# Patient Record
Sex: Male | Born: 1995 | Race: White | Hispanic: No | Marital: Single | Smoking: Never smoker
Health system: Southern US, Community
[De-identification: ages and names within clinical notes are randomized; demographics above are authoritative.]

## PROBLEM LIST (undated history)

## (undated) DIAGNOSIS — R5383 Other fatigue: Secondary | ICD-10-CM

## (undated) DIAGNOSIS — R112 Nausea with vomiting, unspecified: Secondary | ICD-10-CM

## (undated) DIAGNOSIS — K589 Irritable bowel syndrome without diarrhea: Secondary | ICD-10-CM

## (undated) DIAGNOSIS — R51 Headache: Secondary | ICD-10-CM

## (undated) HISTORY — DX: Nausea with vomiting, unspecified: R11.2

## (undated) HISTORY — DX: Irritable bowel syndrome without diarrhea: K58.9

## (undated) HISTORY — DX: Other fatigue: R53.83

## (undated) HISTORY — DX: Headache: R51

---
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
## 2015-01-23 ENCOUNTER — Ambulatory Visit (INDEPENDENT_AMBULATORY_CARE_PROVIDER_SITE_OTHER): Payer: 59 | Admitting: Family Medicine

## 2015-01-23 VITALS — BP 104/76 | HR 76 | Temp 98.0°F | Resp 16 | Ht 73.5 in | Wt 138.0 lb

## 2015-01-23 DIAGNOSIS — K6289 Other specified diseases of anus and rectum: Secondary | ICD-10-CM | POA: Diagnosis not present

## 2015-01-23 DIAGNOSIS — K5090 Crohn's disease, unspecified, without complications: Secondary | ICD-10-CM | POA: Insufficient documentation

## 2015-01-23 DIAGNOSIS — K612 Anorectal abscess: Secondary | ICD-10-CM | POA: Diagnosis not present

## 2015-01-23 DIAGNOSIS — K50919 Crohn's disease, unspecified, with unspecified complications: Secondary | ICD-10-CM | POA: Diagnosis not present

## 2015-01-23 DIAGNOSIS — N433 Hydrocele, unspecified: Secondary | ICD-10-CM | POA: Insufficient documentation

## 2015-01-23 MED ORDER — METRONIDAZOLE 500 MG PO TABS: 500.0000 mg | ORAL_TABLET | Freq: Two times a day (BID) | ORAL | Status: DC

## 2015-01-23 MED ORDER — HYDROCODONE-ACETAMINOPHEN 5-325 MG PO TABS: 1.0000 | ORAL_TABLET | Freq: Four times a day (QID) | ORAL | Status: DC | PRN

## 2015-01-23 NOTE — Progress Notes (Addendum)
Subjective:    Patient ID: Richard Beasley, male    DOB: 1996/04/12, 19 y.o.   MRN: [id number] This chart was scribed for Merri Ray, MD by Steva Colder, ED Scribe. The patient was seen in room 2 at 2:49 PM.   Chief Complaint  Patient presents with  . Mass    rectal region, x 2-3 days    HPI  Richard Beasley is a 19 y.o. male with a medical hx of Crohn's who presents today complaining of mass onset 2-3 days. He reports that he has an abscess right above the anus and he last had one 4-5 years ago and he had it drained. When he had it drained last time it was higher up on his buttocks. Pt initially noticed discomfort and pain when he was walking today. Pt last bowel movement was yesterday that was of a diarrhea consistency. He denies fevers, chills, back pain, abdominal pain, difficulty urinating, and any other symptoms.   Pt is followed at [facility] for his Crohn's and he was last seen in March 2016. Pt takes azathioprine 50 mg 2 tabs daily. Pt has had multiple surgeries for his various ailments. Pt mother called the [place] specialist today and they didn't have an available appointment to see the pt today so they called in Cipro that the pt began today. Pt will not be able to be seen at [facility] until February 02, 2015. Pt notes that his mother has a hx of crohn's.   There are no active problems to display for this patient.  Past Medical History  Diagnosis Date  . Fatigue 01/19/2011  . Generalized headaches 01/19/2011  . Pilonidal cyst 01/19/2011  . Nausea & vomiting 01/19/2011  . IBS (irritable bowel syndrome) 01/19/2011   Past Surgical History  Procedure Laterality Date  . Hydrocele excision / repair  2009  . Elbow fracture surgery  2000  . Fracture surgery  2000    Elbow   Allergies  Allergen Reactions  . Ceclor [Cefaclor]    Prior to Admission medications   Not on File      Review of Systems  Constitutional: Negative for fever and chills.  Gastrointestinal: Positive  for diarrhea. Negative for abdominal pain.  Genitourinary: Negative for difficulty urinating.  Musculoskeletal: Negative for back pain.  Skin:       Abscess to anus       Objective:   Physical Exam  Constitutional: He is oriented to person, place, and time. He appears well-developed and well-nourished. No distress.  HENT:  Head: Normocephalic and atraumatic.  Eyes: EOM are normal.  Neck: Neck supple. No tracheal deviation present.  Cardiovascular: Normal rate, regular rhythm and normal heart sounds.  Exam reveals no gallop and no friction rub.   No murmur heard. Pulmonary/Chest: Effort normal and breath sounds normal. No respiratory distress. He has no wheezes. He has no rales.  Abdominal: Soft. He exhibits no distension. There is no tenderness. There is no CVA tenderness.  Genitourinary:  Approximately 1 cm fluctuant swollen area just superior and left side of the anus. TTP without active discharge. Declined rectal exam to determine if it was a communicating abscess or fistula. No other swelling or abscess noted to the cunneal cleft.   Musculoskeletal: Normal range of motion.  Neurological: He is alert and oriented to person, place, and time.  Skin: Skin is warm and dry.  Psychiatric: He has a normal mood and affect. His behavior is normal.  Nursing note and vitals  reviewed.     BP 104/76 mmHg  Pulse 76  Temp(Src) 98 F (36.7 C)  Resp 16  Ht 6' 1.5" (1.867 m)  Wt 138 lb (62.596 kg)  BMI 17.96 kg/m2  SpO2 99%  3:50 PM Discussed with his surgeon - Dr. Lovett Sox. Recommended I and D, Rx Cipro and Flagyl, with follow up in next 1-2 weeks.options discussed with patient and parent for I and D here or trough emergency room at [facility]. They chose to have procedure here with follow up as scheduled with Dr. Lovett Sox.   Risks (including but not limited to bleeding and infection, or injury to underlying tissues), benefits, and alternatives discussed for superficial  I and D of anorectal abscess.   Verbal consent obtained after any questions were answered. Landmarks noted. Area cleansed with Betadine x3.  Local anesthesia with lidocaine 1% to periphery and central area of abcess injected with 0.8cc lidocaine 1% plain. small amount of yellow purulence noted from area of injection for anesthesia. #15 blade used to incise central area of swelling - 3-29m incision, with depth of approximately 475m  No other exudate expressed. Area of swelling soft, but still some notable swelling. No complications. Gauze applied.  EBL less than 0.5cc. RTC/ER precautions discussed.    Assessment & Plan:   Richard Mcgranahans a [age].o. male Anorectal abscess - Plan: metroNIDAZOLE (FLAGYL) 500 MG tablet  Crohn's disease, unspecified complication  Anal or rectal pain - Plan: HYDROcodone-acetaminophen (NORCO/VICODIN) 5-325 MG per tablet Early anorectal abscess likely. Discussed with his surgeon - [facility] and decided to try superficial I and D.  Smaller amount of purulence expressed than expected, but with superficial I and D - discussed with patient and further exploration or deeper incision deferred.   -will try warm soaks, sitz baths, continue Cipro, add Flagyl. Hydrocodone Rx #10 (SED) if needed for pain, but if any increased swelling or pain in next 2 days - return here [facility] other providers are in that system. Plan discussed with parent and patient. Understanding expressed.   - if improving - follow up with Dr. RaLovett Soxs planned in 10 days.    Meds ordered this encounter  Medications  . ciprofloxacin (CIPRO) 500 MG tablet    Sig: Take 500 mg by mouth.  . azaTHIOprine (IMURAN) 50 MG tablet    Sig: TAKE 2 TABLETS BY MOUTH EVERY DAY  . colestipol (COLESTID) 1 G tablet    Sig: Take by mouth.  . Marland KitchenYDROcodone-acetaminophen (NORCO/VICODIN) 5-325 MG per tablet    Sig: Take 1 tablet by mouth every 6 (six) hours as needed for moderate pain.    Dispense:  10  tablet    Refill:  0  . metroNIDAZOLE (FLAGYL) 500 MG tablet    Sig: Take 1 tablet (500 mg total) by mouth 2 (two) times daily.    Dispense:  20 tablet    Refill:  0   Patient Instructions  THere was only a small amount of pus that was removed with the procedure tonight. Warm soaks/sitz baths, start metronidazole and continue cipro. Hydrocodone if needeed for pain, but if swelling in the affected area increases - you may need to have another attempt at pus removal.  [facility]. Return to the clinic or go to the nearest emergency room if any of your symptoms worsen or new symptoms occur. Keep  follow up as planned with your surgeon.     Peri-Rectal Abscess Your caregiver has diagnosed you as having a peri-rectal abscess. This is an infected area near the rectum that is filled with pus. If the abscess is near the surface of the skin, your caregiver may open (incise) the area and drain the pus. HOME CARE INSTRUCTIONS   If your abscess was opened up and drained. A small piece of gauze may be placed in the opening so that it can drain. Do not remove the gauze unless directed by your caregiver.  A loose dressing may be placed over the abscess site. Change the dressing as often as necessary to keep it clean and dry.  After the drain is removed, the area may be washed with a gentle antiseptic (soap) four times per day.  A warm sitz bath, warm packs or heating pad may be used for pain relief, taking care not to burn yourself.  Return for a wound check in 1 day or as directed.  An "inflatable doughnut" may be used for sitting with added comfort. These can be purchased at a drugstore or medical supply house.  To reduce pain and straining with bowel movements, eat a high fiber diet with plenty of fruits and vegetables. Use stool softeners as recommended by your caregiver. This is especially important if narcotic type pain medications  were prescribed as these may cause marked constipation.  Only take over-the-counter or prescription medicines for pain, discomfort, or fever as directed by your caregiver. SEEK IMMEDIATE MEDICAL CARE IF:   You have increasing pain that is not controlled by medication.  There is increased inflammation (redness), swelling, bleeding, or drainage from the area.  An oral temperature above 102 F (38.9 C) develops.  You develop chills or generalized malaise (feel lethargic or feel "washed out").  You develop any new symptoms (problems) you feel may be related to your present problem. Document Released: 08/12/2000 Document Revised: 11/07/2011 Document Reviewed: 08/12/2008 [facility] Patient Information 2015 [place], [place]. This information is not intended to replace advice given to you by your health care provider. Make sure you discuss any questions you have with your health care provider.     I personally performed the services described in this documentation, which was scribed in my presence. The recorded information has been reviewed and considered, and addended by me as needed.

## 2015-01-23 NOTE — Patient Instructions (Addendum)
THere was only a small amount of pus that was removed with the procedure tonight. Warm soaks/sitz baths, start metronidazole and continue cipro. Hydrocodone if needeed for pain, but if swelling in the affected area increases - you may need to have another attempt at pus removal.  Return here or emergency room at [facility] over the weekend if needed. Return to the clinic or go to the nearest emergency room if any of your symptoms worsen or new symptoms occur. Keep follow up as planned with your surgeon.     Peri-Rectal Abscess Your caregiver has diagnosed you as having a peri-rectal abscess. This is an infected area near the rectum that is filled with pus. If the abscess is near the surface of the skin, your caregiver may open (incise) the area and drain the pus. HOME CARE INSTRUCTIONS   If your abscess was opened up and drained. A small piece of gauze may be placed in the opening so that it can drain. Do not remove the gauze unless directed by your caregiver.  A loose dressing may be placed over the abscess site. Change the dressing as often as necessary to keep it clean and dry.  After the drain is removed, the area may be washed with a gentle antiseptic (soap) four times per day.  A warm sitz bath, warm packs or heating pad may be used for pain relief, taking care not to burn yourself.  Return for a wound check in 1 day or as directed.  An "inflatable doughnut" may be used for sitting with added comfort. These can be purchased at a drugstore or medical supply house.  To reduce pain and straining with bowel movements, eat a high fiber diet with plenty of fruits and vegetables. Use stool softeners as recommended by your caregiver. This is especially important if narcotic type pain medications were prescribed as these may cause marked constipation.  Only take over-the-counter or prescription medicines for pain, discomfort, or fever as directed by your caregiver. SEEK IMMEDIATE MEDICAL  CARE IF:   You have increasing pain that is not controlled by medication.  There is increased inflammation (redness), swelling, bleeding, or drainage from the area.  An oral temperature above 102 F (38.9 C) develops.  You develop chills or generalized malaise (feel lethargic or feel "washed out").  You develop any new symptoms (problems) you feel may be related to your present problem. Document Released: 08/12/2000 Document Revised: 11/07/2011 Document Reviewed: 08/12/2008 [facility] Patient Information 2015 [place], [place]. This information is not intended to replace advice given to you by your health care provider. Make sure you discuss any questions you have with your health care provider.

## 2015-01-24 ENCOUNTER — Telehealth: Payer: Self-pay | Admitting: Family Medicine

## 2015-01-24 NOTE — Telephone Encounter (Signed)
Called home number to check status since his visit yesterday. No answer - LMOM

## 2015-01-25 ENCOUNTER — Telehealth: Payer: Self-pay | Admitting: Family Medicine

## 2015-01-25 NOTE — Telephone Encounter (Signed)
Spoke with Richard Beasley and his mom. Swollen area is better, no bleeding. No fever, tolerating antibiotics. Normal BM today that was not painful. Has not done sitz baths.   - discussed continuing cipro, flagyl, and planned follow up on June 6th with Colorectal surgeon.   - sitz baths may help area heal further.    -if any increased pain, swelling, fever, or painful BM's - rtc for recheck or with his care provider at [facility].   -Understanding expressed and all questions answered.

## 2015-04-26 ENCOUNTER — Ambulatory Visit (INDEPENDENT_AMBULATORY_CARE_PROVIDER_SITE_OTHER): Payer: 59 | Admitting: Internal Medicine

## 2015-04-26 ENCOUNTER — Ambulatory Visit (INDEPENDENT_AMBULATORY_CARE_PROVIDER_SITE_OTHER): Payer: 59

## 2015-04-26 VITALS — BP 127/70 | HR 59 | Temp 97.8°F | Ht 73.5 in | Wt 138.5 lb

## 2015-04-26 DIAGNOSIS — M545 Low back pain, unspecified: Secondary | ICD-10-CM

## 2015-04-26 MED ORDER — HYDROCODONE-ACETAMINOPHEN 5-325 MG PO TABS: ORAL_TABLET | ORAL | Status: DC

## 2015-04-26 MED ORDER — CYCLOBENZAPRINE HCL 10 MG PO TABS: 10.0000 mg | ORAL_TABLET | Freq: Every day | ORAL | Status: DC

## 2015-04-26 MED ORDER — MELOXICAM 15 MG PO TABS: 15.0000 mg | ORAL_TABLET | Freq: Every day | ORAL | Status: DC

## 2015-04-26 NOTE — Progress Notes (Signed)
Subjective:  This chart was scribed for Richard Lin, MD by [name], Medical Scribe. This patient was seen in Room 1 and the patient's care was started at 1:46 PM.     Patient ID: Richard Beasley, male    DOB: Jun 25, 1996, 19 y.o.   MRN: [id number] Chief Complaint  Patient presents with  . Back Pain    off & on x 1week    HPI Richard Beasley is a 19 y.o. male who presents to [facility] complaining of intermittent back pain for about a week. He usually has this issue for about over 6 months as he moves cases for work with lifting. When he has this issue, he usually applies icy hot pads and will feel better the next day. He notes that his left side hurts more than the right side. He denies any sleep disturbances.   He works for [employer].   Patient Active Problem List   Diagnosis Date Noted  . Hydrocele, left 01/23/2015  . Crohn's disease 01/23/2015    Current outpatient prescriptions:  .  colestipol (COLESTID) 1 G tablet, Take by mouth., Disp: , Rfl:  .  azaTHIOprine (IMURAN) 50 MG tablet, TAKE 2 TABLETS BY MOUTH EVERY DAY, Disp: , Rfl:  .  HYDROcodone-acetaminophen (NORCO/VICODIN) 5-325 MG per tablet, Take 1 tablet by mouth every 6 (six) hours as needed for moderate pain. (Patient not taking: Reported on 04/26/2015), Disp: 10 tablet, Rfl: 0 .  metroNIDAZOLE (FLAGYL) 500 MG tablet, Take 1 tablet (500 mg total) by mouth 2 (two) times daily. (Patient not taking: Reported on 04/26/2015), Disp: 20 tablet, Rfl: 0    Review of Systems  Constitutional: Negative for fever, chills and fatigue.  Musculoskeletal: Positive for back pain.  Skin: Negative for wound.  Psychiatric/Behavioral: Negative for sleep disturbance.       Objective:   Physical Exam  Constitutional: He is oriented to person, place, and time. He appears well-developed and well-nourished. No distress.  HENT:  Head: Normocephalic and atraumatic.  Eyes: EOM are normal. Pupils are equal, round, and reactive to  light.  Neck: Neck supple.  Cardiovascular: Normal rate.   Pulmonary/Chest: Effort normal. No respiratory distress.  Musculoskeletal: Normal range of motion.  Tender over lumbar midline pons straight leg raise at 75 on the right but negative on the left. Mild pain with twisting and extension. Deep tendon reflexes symmetrical. No sensory losses and lower extremities.  Neurological: He is alert and oriented to person, place, and time.  Skin: Skin is warm and dry.  Psychiatric: He has a normal mood and affect. His behavior is normal.  Nursing note and vitals reviewed.   BP 127/70 mmHg  Pulse 59  Temp(Src) 97.8 F (36.6 C) (Oral)  Ht 6' 1.5" (1.867 m)  Wt 138 lb 8 oz (62.823 kg)  BMI 18.02 kg/m2  SpO2 99%  [facility] reading (PRIMARY) by  Dr. Laney Pastor? 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     Assessment & Plan:  Acute superimposed on chronic low back pain secondary to lifting injury Meds ordered this encounter  Medications  . cyclobenzaprine (FLEXERIL) 10 MG tablet    Sig: Take 1 tablet (10 mg total) by mouth at bedtime.    Dispense:  30 tablet    Refill:  0  . meloxicam (MOBIC) 15 MG tablet    Sig: Take 1 tablet (15 mg total) by mouth daily.    Dispense:  30 tablet    Refill:  0  . HYDROcodone-acetaminophen (NORCO/VICODIN) 5-325 MG  per tablet    Sig: 1 at bedtime for back pain--may repeat in 4-6h if wakes    Dispense:  10 tablet    Refill:  0   Exercises given If not resp. Do PT I have completed the patient encounter in its entirety as documented by the scribe, with editing by me where necessary. Olie Dibert P. Laney Pastor, M.D.

## 2015-04-26 NOTE — Patient Instructions (Signed)

## 2015-06-04 ENCOUNTER — Emergency Department (HOSPITAL_COMMUNITY): Payer: 59

## 2015-06-04 ENCOUNTER — Emergency Department (HOSPITAL_COMMUNITY)
Admission: EM | Admit: 2015-06-04 | Discharge: 2015-06-05 | Disposition: A | Discharge status: Home / Self Care | Payer: 59 | Attending: Emergency Medicine | Admitting: Emergency Medicine

## 2015-06-04 ENCOUNTER — Encounter (HOSPITAL_COMMUNITY): Payer: Self-pay

## 2015-06-04 DIAGNOSIS — N50812 Left testicular pain: Secondary | ICD-10-CM | POA: Insufficient documentation

## 2015-06-04 DIAGNOSIS — Z791 Long term (current) use of non-steroidal anti-inflammatories (NSAID): Secondary | ICD-10-CM | POA: Diagnosis not present

## 2015-06-04 DIAGNOSIS — Z79899 Other long term (current) drug therapy: Secondary | ICD-10-CM | POA: Insufficient documentation

## 2015-06-04 DIAGNOSIS — N50819 Testicular pain, unspecified: Secondary | ICD-10-CM

## 2015-06-04 DIAGNOSIS — N50811 Right testicular pain: Secondary | ICD-10-CM | POA: Insufficient documentation

## 2015-06-04 DIAGNOSIS — Z872 Personal history of diseases of the skin and subcutaneous tissue: Secondary | ICD-10-CM | POA: Diagnosis not present

## 2015-06-04 DIAGNOSIS — Z8719 Personal history of other diseases of the digestive system: Secondary | ICD-10-CM | POA: Diagnosis not present

## 2015-06-04 NOTE — ED Notes (Signed)
Pt complains of groin pain for a week, hx of hydrocele when he was 13, no swelling, no injury

## 2015-06-04 NOTE — ED Notes (Signed)
[email or web address] 

## 2015-06-04 NOTE — ED Provider Notes (Signed)
CSN: [id number]     Arrival date & time 06/04/15  2207 History   First MD Initiated Contact with Patient 06/04/15 2250     Chief Complaint  Patient presents with  . Groin Pain   HPI  patient presents to the emergency room with complaints of bilateral testicle pain that started about one week ago. Patient has  Had intermittent aching in his  Testicles bilaterally that as increased in severity.   Patient states it's constant ache now. Denies any trouble with dysuria. No discharge. No recent injuries. He has not noticed any swelling. Because of the worsening symptoms he decided to come in to the emergency room this evening. He has not seen anyone for this problem prior to tonight. He does have history of a hydrocele when he was 19 years old. Past Medical History  Diagnosis Date  . Fatigue 01/19/2011  . Generalized headaches 01/19/2011  . Pilonidal cyst 01/19/2011  . Nausea & vomiting 01/19/2011  . IBS (irritable bowel syndrome) 01/19/2011   Past Surgical History  Procedure Laterality Date  . Hydrocele excision / repair  2009  . Elbow fracture surgery  2000  . Fracture surgery  2000    Elbow   History reviewed. No pertinent family history. Social History  Substance Use Topics  . Smoking status: Never Smoker   . Smokeless tobacco: Never Used  . Alcohol Use: No    Review of Systems  All other systems reviewed and are negative.     Allergies  Ceclor  Home Medications   Prior to Admission medications   Medication Sig Start Date End Date Taking? Authorizing Provider  azaTHIOprine (IMURAN) 50 MG tablet Take 100 mg by mouth daily.  07/04/14  Yes Historical Provider, MD  colestipol (COLESTID) 1 G tablet Take 1 g by mouth daily.  12/12/14 12/12/15 Yes Historical Provider, MD  cyclobenzaprine (FLEXERIL) 10 MG tablet Take 1 tablet (10 mg total) by mouth at bedtime. 04/26/15  Yes Leandrew Koyanagi, MD  HYDROcodone-acetaminophen (NORCO/VICODIN) 5-325 MG per tablet 1 at bedtime for back  pain--may repeat in 4-6h if wakes Patient taking differently: Take 1 tablet by mouth at bedtime. 1 at bedtime for back pain--may repeat in 4-6h if wakes 04/26/15  Yes Leandrew Koyanagi, MD  meloxicam (MOBIC) 15 MG tablet Take 1 tablet (15 mg total) by mouth daily. 04/26/15  Yes Leandrew Koyanagi, MD  Vitamin D, Ergocalciferol, (DRISDOL) 50000 UNITS CAPS capsule Take 1 capsule by mouth once a week. 05/09/15  Yes Historical Provider, MD  naproxen (NAPROSYN) 500 MG tablet Take 1 tablet (500 mg total) by mouth 2 (two) times daily. 06/05/15   Dorie Rank, MD   BP 119/75 mmHg  Pulse 66  Temp(Src) 98.2 F (36.8 C) (Oral)  Resp 20  Ht 6' 1"  (1.854 m)  Wt 138 lb (62.596 kg)  BMI 18.21 kg/m2  SpO2 100% Physical Exam  Constitutional: He appears well-developed and well-nourished. No distress.  HENT:  Head: Normocephalic and atraumatic.  Right Ear: External ear normal.  Left Ear: External ear normal.  Eyes: Conjunctivae are normal. Right eye exhibits no discharge. Left eye exhibits no discharge. No scleral icterus.  Neck: Neck supple. No tracheal deviation present.  Cardiovascular: Normal rate.   Pulmonary/Chest: Effort normal. No stridor. No respiratory distress. He has no wheezes. He has no rales.  Abdominal: Soft. Bowel sounds are normal. He exhibits no distension. There is no tenderness. There is no rebound. Hernia confirmed negative in the right inguinal area and  confirmed negative in the left inguinal area.  Genitourinary: Right testis shows no mass, no swelling and no tenderness. Left testis shows swelling. Left testis shows no mass and no tenderness.  Musculoskeletal: He exhibits no edema.  Neurological: He is alert. Cranial nerve deficit: no gross deficits.  Skin: Skin is warm and dry. No rash noted.  Psychiatric: He has a normal mood and affect.  Nursing note and vitals reviewed.   ED Course  Procedures (including critical care time) Labs Review Labs Reviewed  CBC WITH  DIFFERENTIAL/PLATELET - Abnormal; Notable for the following:    RBC 4.19 (*)    Hemoglobin 12.3 (*)    HCT 38.1 (*)    Neutro Abs 8.4 (*)    All other components within normal limits  BASIC METABOLIC PANEL  URINALYSIS, ROUTINE W REFLEX MICROSCOPIC (NOT AT [facility])  RPR  HIV ANTIBODY (ROUTINE TESTING)  GC/CHLAMYDIA PROBE AMP ([place]) NOT AT [facility]    Imaging Review US Scrotum  06/05/2015   CLINICAL DATA:  Bilateral testicular pain, duration 1 week  EXAM: SCROTAL ULTRASOUND  DOPPLER ULTRASOUND OF THE TESTICLES  TECHNIQUE: Complete ultrasound examination of the testicles, epididymis, and other scrotal structures was performed. Color and spectral Doppler ultrasound were also utilized to evaluate blood flow to the testicles.  COMPARISON:  None.  FINDINGS: Right testicle  Measurements: 5.0 x 2.2 x 2.9 cm. No mass or microlithiasis visualized.  Left testicle  Measurements: 4.5 x 1.9 x 2.7 cm. No mass or microlithiasis visualized.  Right epididymis:  Normal in size and appearance.  Left epididymis:  Normal in size and appearance.  Hydrocele:  None visualized.  Varicocele:  None visualized.  Pulsed Doppler interrogation of both testes demonstrates normal low resistance arterial and venous waveforms bilaterally.  IMPRESSION: Negative. No testicular mass or torsion. No abnormal scrotal fluid collections.   Electronically Signed   By: Andreas Newport M.D.   On: 06/05/2015 00:21   [place] Art/ven Flow Abd Pelv Doppler  06/05/2015   CLINICAL DATA:  Bilateral testicular pain, duration 1 week  EXAM: SCROTAL ULTRASOUND  DOPPLER ULTRASOUND OF THE TESTICLES  TECHNIQUE: Complete ultrasound examination of the testicles, epididymis, and other scrotal structures was performed. Color and spectral Doppler ultrasound were also utilized to evaluate blood flow to the testicles.  COMPARISON:  None.  FINDINGS: Right testicle  Measurements: 5.0 x 2.2 x 2.9 cm. No mass or microlithiasis visualized.  Left testicle  Measurements: 4.5  x 1.9 x 2.7 cm. No mass or microlithiasis visualized.  Right epididymis:  Normal in size and appearance.  Left epididymis:  Normal in size and appearance.  Hydrocele:  None visualized.  Varicocele:  None visualized.  Pulsed Doppler interrogation of both testes demonstrates normal low resistance arterial and venous waveforms bilaterally.  IMPRESSION: Negative. No testicular mass or torsion. No abnormal scrotal fluid collections.   Electronically Signed   By: Andreas Newport M.D.   On: 06/05/2015 00:21   I have personally reviewed and evaluated these images and lab results as part of my medical decision-making.   MDM   Final diagnoses:  Testicle pain    Labs, exam and ultrasound are normal.  Cause of his testicle pain is unclear.  No sign of infection.  No torsion.  Recc Follow up with urology.    Dorie Rank, MD 06/05/15 0100

## 2015-06-05 ENCOUNTER — Telehealth: Payer: Self-pay

## 2015-06-05 DIAGNOSIS — N50819 Testicular pain, unspecified: Secondary | ICD-10-CM

## 2015-06-05 LAB — URINALYSIS, ROUTINE W REFLEX MICROSCOPIC
BILIRUBIN URINE: NEGATIVE
GLUCOSE, UA: NEGATIVE mg/dL
HGB URINE DIPSTICK: NEGATIVE
Ketones, ur: NEGATIVE mg/dL
Leukocytes, UA: NEGATIVE
Nitrite: NEGATIVE
PROTEIN: NEGATIVE mg/dL
Specific Gravity, Urine: 1.006 (ref 1.005–1.030)
UROBILINOGEN UA: 0.2 mg/dL (ref 0.0–1.0)
pH: 7 (ref 5.0–8.0)

## 2015-06-05 LAB — CBC WITH DIFFERENTIAL/PLATELET
BASOS PCT: 0 %
Basophils Absolute: 0 10*3/uL (ref 0.0–0.1)
EOS ABS: 0.1 10*3/uL (ref 0.0–0.7)
EOS PCT: 1 %
HCT: 38.1 % — ABNORMAL LOW (ref 39.0–52.0)
Hemoglobin: 12.3 g/dL — ABNORMAL LOW (ref 13.0–17.0)
LYMPHS ABS: 1.2 10*3/uL (ref 0.7–4.0)
Lymphocytes Relative: 11 %
MCH: 29.4 pg (ref 26.0–34.0)
MCHC: 32.3 g/dL (ref 30.0–36.0)
MCV: 90.9 fL (ref 78.0–100.0)
MONO ABS: 0.6 10*3/uL (ref 0.1–1.0)
MONOS PCT: 6 %
NEUTROS PCT: 82 %
Neutro Abs: 8.4 10*3/uL — ABNORMAL HIGH (ref 1.7–7.7)
PLATELETS: 213 10*3/uL (ref 150–400)
RBC: 4.19 MIL/uL — ABNORMAL LOW (ref 4.22–5.81)
RDW: 13.2 % (ref 11.5–15.5)
WBC: 10.3 10*3/uL (ref 4.0–10.5)

## 2015-06-05 LAB — BASIC METABOLIC PANEL
Anion gap: 6 (ref 5–15)
BUN: 12 mg/dL (ref 6–20)
CALCIUM: 9.2 mg/dL (ref 8.9–10.3)
CHLORIDE: 109 mmol/L (ref 101–111)
CO2: 27 mmol/L (ref 22–32)
CREATININE: 0.95 mg/dL (ref 0.61–1.24)
GFR calc non Af Amer: >60 mL/min (ref >60)
Glucose, Bld: 98 mg/dL (ref 65–99)
Potassium: 3.9 mmol/L (ref 3.5–5.1)
SODIUM: 142 mmol/L (ref 135–145)

## 2015-06-05 LAB — RPR: RPR: NONREACTIVE

## 2015-06-05 LAB — HIV ANTIBODY (ROUTINE TESTING W REFLEX): HIV Screen 4th Generation wRfx: NONREACTIVE

## 2015-06-05 MED ORDER — NAPROXEN 500 MG PO TABS: 500.0000 mg | ORAL_TABLET | Freq: Two times a day (BID) | ORAL | Status: DC

## 2015-06-05 NOTE — Telephone Encounter (Signed)
Pt needs to be seen here first correct?

## 2015-06-05 NOTE — Telephone Encounter (Signed)
The patient's mother called to request a referral to a urologist for her son after his recent visit to the ER.  He also has [facility] and needs a diagnosis code in order to submit the referral.  CB#: [phone]

## 2015-06-05 NOTE — Telephone Encounter (Signed)
ED notes reviewed.  Referral made.  Orders Placed This Encounter  Procedures  . Ambulatory referral to Urology    Referral Priority:  Routine    Referral Type:  Consultation    Referral Reason:  Specialty Services Required    Requested Specialty:  Urology    Number of Visits Requested:  1

## 2015-06-05 NOTE — Telephone Encounter (Signed)
Mom advised. Referrals. He wants to see Dr. Janice Norrie. As soon as possible.

## 2015-06-16 ENCOUNTER — Ambulatory Visit (INDEPENDENT_AMBULATORY_CARE_PROVIDER_SITE_OTHER): Payer: 59 | Admitting: Family Medicine

## 2015-06-16 VITALS — BP 114/74 | HR 68 | Temp 98.1°F | Resp 16 | Ht 72.5 in | Wt 133.0 lb

## 2015-06-16 DIAGNOSIS — X503XXA Overexertion from repetitive movements, initial encounter: Secondary | ICD-10-CM

## 2015-06-16 DIAGNOSIS — S39012A Strain of muscle, fascia and tendon of lower back, initial encounter: Secondary | ICD-10-CM

## 2015-06-16 DIAGNOSIS — M545 Low back pain: Secondary | ICD-10-CM

## 2015-06-16 MED ORDER — CYCLOBENZAPRINE HCL 5 MG PO TABS: 5.0000 mg | ORAL_TABLET | Freq: Three times a day (TID) | ORAL | Status: DC | PRN

## 2015-06-16 MED ORDER — MELOXICAM 7.5 MG PO TABS: 7.5000 mg | ORAL_TABLET | Freq: Every day | ORAL | Status: DC | PRN

## 2015-06-16 MED ORDER — HYDROCODONE-ACETAMINOPHEN 5-325 MG PO TABS: 1.0000 | ORAL_TABLET | Freq: Four times a day (QID) | ORAL | Status: DC | PRN

## 2015-06-16 NOTE — Patient Instructions (Signed)
Correct lifting technique as discussed, exercises below. meloxicam once during the day as needed (avoid taking other NSAIDS like Alleve or Ibuprofen while taking this, and make sure it is ok to take with your Crohn's) and then flexeril if needed at bedtime for muscle spasm. Hydrocodone only if needed for breakthrough pain. Both hydrocodone and flexeril cause sedation, so should not drive or operate heavy machinery while taking these medicines. Return in next week if not improving.   Return to the clinic or go to the nearest emergency room if any of your symptoms worsen or new symptoms occur.   Low Back Strain With Rehab A strain is an injury in which a tendon or muscle is torn. The muscles and tendons of the lower back are vulnerable to strains. However, these muscles and tendons are very strong and require a great force to be injured. Strains are classified into three categories. Grade 1 strains cause pain, but the tendon is not lengthened. Grade 2 strains include a lengthened ligament, due to the ligament being stretched or partially ruptured. With grade 2 strains there is still function, although the function may be decreased. Grade 3 strains involve a complete tear of the tendon or muscle, and function is usually impaired. SYMPTOMS   Pain in the lower back.  Pain that affects one side more than the other.  Pain that gets worse with movement and may be felt in the hip, buttocks, or back of the thigh.  Muscle spasms of the muscles in the back.  Swelling along the muscles of the back.  Loss of strength of the back muscles.  Crackling sound (crepitation) when the muscles are touched. CAUSES  Lower back strains occur when a force is placed on the muscles or tendons that is greater than they can handle. Common causes of injury include:  Prolonged overuse of the muscle-tendon units in the lower back, usually from incorrect posture.  A single violent injury or force applied to the back. RISK  INCREASES WITH:  Sports that involve twisting forces on the spine or a lot of bending at the waist (football, rugby, weightlifting, bowling, golf, tennis, speed skating, racquetball, swimming, running, gymnastics, diving).  Poor strength and flexibility.  Failure to warm up properly before activity.  Family history of lower back pain or disk disorders.  Previous back injury or surgery (especially fusion).  Poor posture with lifting, especially heavy objects.  Prolonged sitting, especially with poor posture. PREVENTION   Learn and use proper posture when sitting or lifting (maintain proper posture when sitting, lift using the knees and legs, not at the waist).  Warm up and stretch properly before activity.  Allow for adequate recovery between workouts.  Maintain physical fitness:  Strength, flexibility, and endurance.  Cardiovascular fitness. PROGNOSIS  If treated properly, lower back strains usually heal within 6 weeks. RELATED COMPLICATIONS   Recurring symptoms, resulting in a chronic problem.  Chronic inflammation, scarring, and partial muscle-tendon tear.  Delayed healing or resolution of symptoms.  Prolonged disability. TREATMENT  Treatment first involves the use of ice and medicine, to reduce pain and inflammation. The use of strengthening and stretching exercises may help reduce pain with activity. These exercises may be performed at home or with a therapist. Severe injuries may require referral to a therapist for further evaluation and treatment, such as ultrasound. Your caregiver may advise that you wear a back brace or corset, to help reduce pain and discomfort. Often, prolonged bed rest results in greater harm then benefit. Corticosteroid  injections may be recommended. However, these should be reserved for the most serious cases. It is important to avoid using your back when lifting objects. At night, sleep on your back on a firm mattress with a pillow placed under  your knees. If non-surgical treatment is unsuccessful, surgery may be needed.  MEDICATION   If pain medicine is needed, nonsteroidal anti-inflammatory medicines (aspirin and ibuprofen), or other minor pain relievers (acetaminophen), are often advised.  Do not take pain medicine for 7 days before surgery.  Prescription pain relievers may be given, if your caregiver thinks they are needed. Use only as directed and only as much as you need.  Ointments applied to the skin may be helpful.  Corticosteroid injections may be given by your caregiver. These injections should be reserved for the most serious cases, because they may only be given a certain number of times. HEAT AND COLD  Cold treatment (icing) should be applied for 10 to 15 minutes every 2 to 3 hours for inflammation and pain, and immediately after activity that aggravates your symptoms. Use ice packs or an ice massage.  Heat treatment may be used before performing stretching and strengthening activities prescribed by your caregiver, physical therapist, or athletic trainer. Use a heat pack or a warm water soak. SEEK MEDICAL CARE IF:   Symptoms get worse or do not improve in 2 to 4 weeks, despite treatment.  You develop numbness, weakness, or loss of bowel or bladder function.  New, unexplained symptoms develop. (Drugs used in treatment may produce side effects.) EXERCISES  RANGE OF MOTION (ROM) AND STRETCHING EXERCISES - Low Back Strain Most people with lower back pain will find that their symptoms get worse with excessive bending forward (flexion) or arching at the lower back (extension). The exercises which will help resolve your symptoms will focus on the opposite motion.  Your physician, physical therapist or athletic trainer will help you determine which exercises will be most helpful to resolve your lower back pain. Do not complete any exercises without first consulting with your caregiver. Discontinue any exercises which make  your symptoms worse until you speak to your caregiver.  If you have pain, numbness or tingling which travels down into your buttocks, leg or foot, the goal of the therapy is for these symptoms to move closer to your back and eventually resolve. Sometimes, these leg symptoms will get better, but your lower back pain may worsen. This is typically an indication of progress in your rehabilitation. Be very alert to any changes in your symptoms and the activities in which you participated in the 24 hours prior to the change. Sharing this information with your caregiver will allow him/her to most efficiently treat your condition.  These exercises may help you when beginning to rehabilitate your injury. Your symptoms may resolve with or without further involvement from your physician, physical therapist or athletic trainer. While completing these exercises, remember:  Restoring tissue flexibility helps normal motion to return to the joints. This allows healthier, less painful movement and activity.  An effective stretch should be held for at least 30 seconds.  A stretch should never be painful. You should only feel a gentle lengthening or release in the stretched tissue. FLEXION RANGE OF MOTION AND STRETCHING EXERCISES: STRETCH - Flexion, Single Knee to Chest   Lie on a firm bed or floor with both legs extended in front of you.  Keeping one leg in contact with the floor, bring your opposite knee to your chest. Hold your  leg in place by either grabbing behind your thigh or at your knee.  Pull until you feel a gentle stretch in your lower back. Hold __________ seconds.  Slowly release your grasp and repeat the exercise with the opposite side. Repeat __________ times. Complete this exercise __________ times per day.  STRETCH - Flexion, Double Knee to Chest   Lie on a firm bed or floor with both legs extended in front of you.  Keeping one leg in contact with the floor, bring your opposite knee to your  chest.  Tense your stomach muscles to support your back and then lift your other knee to your chest. Hold your legs in place by either grabbing behind your thighs or at your knees.  Pull both knees toward your chest until you feel a gentle stretch in your lower back. Hold __________ seconds.  Tense your stomach muscles and slowly return one leg at a time to the floor. Repeat __________ times. Complete this exercise __________ times per day.  STRETCH - Low Trunk Rotation  Lie on a firm bed or floor. Keeping your legs in front of you, bend your knees so they are both pointed toward the ceiling and your feet are flat on the floor.  Extend your arms out to the side. This will stabilize your upper body by keeping your shoulders in contact with the floor.  Gently and slowly drop both knees together to one side until you feel a gentle stretch in your lower back. Hold for __________ seconds.  Tense your stomach muscles to support your lower back as you bring your knees back to the starting position. Repeat the exercise to the other side. Repeat __________ times. Complete this exercise __________ times per day  EXTENSION RANGE OF MOTION AND FLEXIBILITY EXERCISES: STRETCH - Extension, Prone on Elbows   Lie on your stomach on the floor, a bed will be too soft. Place your palms about shoulder width apart and at the height of your head.  Place your elbows under your shoulders. If this is too painful, stack pillows under your chest.  Allow your body to relax so that your hips drop lower and make contact more completely with the floor.  Hold this position for __________ seconds.  Slowly return to lying flat on the floor. Repeat __________ times. Complete this exercise __________ times per day.  RANGE OF MOTION - Extension, Prone Press Ups  Lie on your stomach on the floor, a bed will be too soft. Place your palms about shoulder width apart and at the height of your head.  Keeping your back as  relaxed as possible, slowly straighten your elbows while keeping your hips on the floor. You may adjust the placement of your hands to maximize your comfort. As you gain motion, your hands will come more underneath your shoulders.  Hold this position __________ seconds.  Slowly return to lying flat on the floor. Repeat __________ times. Complete this exercise __________ times per day.  RANGE OF MOTION- Quadruped, Neutral Spine   Assume a hands and knees position on a firm surface. Keep your hands under your shoulders and your knees under your hips. You may place padding under your knees for comfort.  Drop your head and point your tail bone toward the ground below you. This will round out your lower back like an angry cat. Hold this position for __________ seconds.  Slowly lift your head and release your tail bone so that your back sags into a large arch, like  an old horse.  Hold this position for __________ seconds.  Repeat this until you feel limber in your lower back.  Now, find your "sweet spot." This will be the most comfortable position somewhere between the two previous positions. This is your neutral spine. Once you have found this position, tense your stomach muscles to support your lower back.  Hold this position for __________ seconds. Repeat __________ times. Complete this exercise __________ times per day.  STRENGTHENING EXERCISES - Low Back Strain These exercises may help you when beginning to rehabilitate your injury. These exercises should be done near your "sweet spot." This is the neutral, low-back arch, somewhere between fully rounded and fully arched, that is your least painful position. When performed in this safe range of motion, these exercises can be used for people who have either a flexion or extension based injury. These exercises may resolve your symptoms with or without further involvement from your physician, physical therapist or athletic trainer. While completing  these exercises, remember:   Muscles can gain both the endurance and the strength needed for everyday activities through controlled exercises.  Complete these exercises as instructed by your physician, physical therapist or athletic trainer. Increase the resistance and repetitions only as guided.  You may experience muscle soreness or fatigue, but the pain or discomfort you are trying to eliminate should never worsen during these exercises. If this pain does worsen, stop and make certain you are following the directions exactly. If the pain is still present after adjustments, discontinue the exercise until you can discuss the trouble with your caregiver. STRENGTHENING - Deep Abdominals, Pelvic Tilt  Lie on a firm bed or floor. Keeping your legs in front of you, bend your knees so they are both pointed toward the ceiling and your feet are flat on the floor.  Tense your lower abdominal muscles to press your lower back into the floor. This motion will rotate your pelvis so that your tail bone is scooping upwards rather than pointing at your feet or into the floor.  With a gentle tension and even breathing, hold this position for __________ seconds. Repeat __________ times. Complete this exercise __________ times per day.  STRENGTHENING - Abdominals, Crunches   Lie on a firm bed or floor. Keeping your legs in front of you, bend your knees so they are both pointed toward the ceiling and your feet are flat on the floor. Cross your arms over your chest.  Slightly tip your chin down without bending your neck.  Tense your abdominals and slowly lift your trunk high enough to just clear your shoulder blades. Lifting higher can put excessive stress on the lower back and does not further strengthen your abdominal muscles.  Control your return to the starting position. Repeat __________ times. Complete this exercise __________ times per day.  STRENGTHENING - Quadruped, Opposite UE/LE Lift   Assume a  hands and knees position on a firm surface. Keep your hands under your shoulders and your knees under your hips. You may place padding under your knees for comfort.  Find your neutral spine and gently tense your abdominal muscles so that you can maintain this position. Your shoulders and hips should form a rectangle that is parallel with the floor and is not twisted.  Keeping your trunk steady, lift your right hand no higher than your shoulder and then your left leg no higher than your hip. Make sure you are not holding your breath. Hold this position __________ seconds.  Continuing to keep  your abdominal muscles tense and your back steady, slowly return to your starting position. Repeat with the opposite arm and leg. Repeat __________ times. Complete this exercise __________ times per day.  STRENGTHENING - Lower Abdominals, Double Knee Lift  Lie on a firm bed or floor. Keeping your legs in front of you, bend your knees so they are both pointed toward the ceiling and your feet are flat on the floor.  Tense your abdominal muscles to brace your lower back and slowly lift both of your knees until they come over your hips. Be certain not to hold your breath.  Hold __________ seconds. Using your abdominal muscles, return to the starting position in a slow and controlled manner. Repeat __________ times. Complete this exercise __________ times per day.  POSTURE AND BODY MECHANICS CONSIDERATIONS - Low Back Strain Keeping correct posture when sitting, standing or completing your activities will reduce the stress put on different body tissues, allowing injured tissues a chance to heal and limiting painful experiences. The following are general guidelines for improved posture. Your physician or physical therapist will provide you with any instructions specific to your needs. While reading these guidelines, remember:  The exercises prescribed by your provider will help you have the flexibility and strength to  maintain correct postures.  The correct posture provides the best environment for your joints to work. All of your joints have less wear and tear when properly supported by a spine with good posture. This means you will experience a healthier, less painful body.  Correct posture must be practiced with all of your activities, especially prolonged sitting and standing. Correct posture is as important when doing repetitive low-stress activities (typing) as it is when doing a single heavy-load activity (lifting). RESTING POSITIONS Consider which positions are most painful for you when choosing a resting position. If you have pain with flexion-based activities (sitting, bending, stooping, squatting), choose a position that allows you to rest in a less flexed posture. You would want to avoid curling into a fetal position on your side. If your pain worsens with extension-based activities (prolonged standing, working overhead), avoid resting in an extended position such as sleeping on your stomach. Most people will find more comfort when they rest with their spine in a more neutral position, neither too rounded nor too arched. Lying on a non-sagging bed on your side with a pillow between your knees, or on your back with a pillow under your knees will often provide some relief. Keep in mind, being in any one position for a prolonged period of time, no matter how correct your posture, can still lead to stiffness. PROPER SITTING POSTURE In order to minimize stress and discomfort on your spine, you must sit with correct posture. Sitting with good posture should be effortless for a healthy body. Returning to good posture is a gradual process. Many people can work toward this most comfortably by using various supports until they have the flexibility and strength to maintain this posture on their own. When sitting with proper posture, your ears will fall over your shoulders and your shoulders will fall over your hips. You  should use the back of the chair to support your upper back. Your lower back will be in a neutral position, just slightly arched. You may place a small pillow or folded towel at the base of your lower back for support.  When working at a desk, create an environment that supports good, upright posture. Without extra support, muscles tire, which leads to  excessive strain on joints and other tissues. Keep these recommendations in mind: CHAIR:  A chair should be able to slide under your desk when your back makes contact with the back of the chair. This allows you to work closely.  The chair's height should allow your eyes to be level with the upper part of your monitor and your hands to be slightly lower than your elbows. BODY POSITION  Your feet should make contact with the floor. If this is not possible, use a foot rest.  Keep your ears over your shoulders. This will reduce stress on your neck and lower back. INCORRECT SITTING POSTURES  If you are feeling tired and unable to assume a healthy sitting posture, do not slouch or slump. This puts excessive strain on your back tissues, causing more damage and pain. Healthier options include:  Using more support, like a lumbar pillow.  Switching tasks to something that requires you to be upright or walking.  Talking a brief walk.  Lying down to rest in a neutral-spine position. PROLONGED STANDING WHILE SLIGHTLY LEANING FORWARD  When completing a task that requires you to lean forward while standing in one place for a long time, place either foot up on a stationary 2-4 inch high object to help maintain the best posture. When both feet are on the ground, the lower back tends to lose its slight inward curve. If this curve flattens (or becomes too large), then the back and your other joints will experience too much stress, tire more quickly, and can cause pain. CORRECT STANDING POSTURES Proper standing posture should be assumed with all daily activities,  even if they only take a few moments, like when brushing your teeth. As in sitting, your ears should fall over your shoulders and your shoulders should fall over your hips. You should keep a slight tension in your abdominal muscles to brace your spine. Your tailbone should point down to the ground, not behind your body, resulting in an over-extended swayback posture.  INCORRECT STANDING POSTURES  Common incorrect standing postures include a forward head, locked knees and/or an excessive swayback. WALKING Walk with an upright posture. Your ears, shoulders and hips should all line-up. PROLONGED ACTIVITY IN A FLEXED POSITION When completing a task that requires you to bend forward at your waist or lean over a low surface, try to find a way to stabilize 3 out of 4 of your limbs. You can place a hand or elbow on your thigh or rest a knee on the surface you are reaching across. This will provide you more stability so that your muscles do not fatigue as quickly. By keeping your knees relaxed, or slightly bent, you will also reduce stress across your lower back. CORRECT LIFTING TECHNIQUES DO :   Assume a wide stance. This will provide you more stability and the opportunity to get as close as possible to the object which you are lifting.  Tense your abdominals to brace your spine. Bend at the knees and hips. Keeping your back locked in a neutral-spine position, lift using your leg muscles. Lift with your legs, keeping your back straight.  Test the weight of unknown objects before attempting to lift them.  Try to keep your elbows locked down at your sides in order get the best strength from your shoulders when carrying an object.  Always ask for help when lifting heavy or awkward objects. INCORRECT LIFTING TECHNIQUES DO NOT:   Lock your knees when lifting, even if it is a  small object.  Bend and twist. Pivot at your feet or move your feet when needing to change directions.  Assume that you can safely  pick up even a paper clip without proper posture.   This information is not intended to replace advice given to you by your health care provider. Make sure you discuss any questions you have with your health care provider.   Document Released: 08/15/2005 Document Revised: 09/05/2014 Document Reviewed: 11/27/2008 [employer] Interactive Patient Education [employer].

## 2015-06-16 NOTE — Progress Notes (Signed)
Subjective:    Patient ID: Richard Beasley, male    DOB: [birth year], 19 y.o.   MRN: [id number] This chart was scribed for Merri Ray, MD by Marti Sleigh, Medical Scribe. This patient was seen in Room 5 and the patient's care was started a 2:56 PM.  Chief Complaint  Patient presents with  . Back Pain    Low back pain from lifting heavy thinks at work. The pain goes down to legs x 2 days   HPI HPI Comments: Richard Beasley is a 19 y.o. male with a hx of chrones disease who presents to [facility] complaining of low back pain that started two days ago. He states he has had mild back pain for the last three weeks, but two days ago the pain became worse and began radiating down his legs. He works for [employer], and carries heavy boxes regularly. The pain radiates to bilateral anterior thighs, and left lateral leg. He also states his left leg feels weaker than right. He denies saddle numbness, or loss of control or bowel or bladder control.   He has also had testicular pain for the last two weeks which coincided with the onset of his back pain. He states he has had testicular pain that has coincided with his back pain in the past. It feels like he was kicked in the testicles. He had a negative scrotal ultrasound on October 6th, in the ED. He was referred Dr. Pilar Jarvis from the ED but has not been seen yet. Denies discharge from penis. He denies new sexual partners, and states he is not sexually active.   The pt was seen in August with low back pain acute on chronic secondary to lifting. He was treated with mobic, flexeril, hydrocodone and home exercises. He states he never did the home exercises. X-ray of lumbar spine was normal at that time.   Patient Active Problem List   Diagnosis Date Noted  . Hydrocele, left 01/23/2015  . Crohn's disease ([place]) 01/23/2015  . CD (Crohn's disease) ([place]) 01/23/2015  . Hydrocele 01/23/2015   Past Medical History  Diagnosis Date  . Fatigue 01/19/2011  .  Generalized headaches 01/19/2011  . Pilonidal cyst 01/19/2011  . Nausea & vomiting 01/19/2011  . IBS (irritable bowel syndrome) 01/19/2011   Past Surgical History  Procedure Laterality Date  . Hydrocele excision / repair  2009  . Elbow fracture surgery  2000  . Fracture surgery  2000    Elbow   Allergies  Allergen Reactions  . Ceclor [Cefaclor]    Prior to Admission medications   Medication Sig Start Date End Date Taking? Authorizing Provider  azaTHIOprine (IMURAN) 50 MG tablet Take 100 mg by mouth daily.  07/04/14  Yes Historical Provider, MD  colestipol (COLESTID) 1 G tablet Take 1 g by mouth daily.  12/12/14 12/12/15 Yes Historical Provider, MD  Vitamin D, Ergocalciferol, (DRISDOL) 50000 UNITS CAPS capsule Take 1 capsule by mouth once a week. 05/09/15  Yes Historical Provider, MD  cyclobenzaprine (FLEXERIL) 5 MG tablet Take 1-2 tablets (5-10 mg total) by mouth 3 (three) times daily as needed. 06/16/15   Wendie Agreste, MD  HYDROcodone-acetaminophen (NORCO/VICODIN) 5-325 MG tablet Take 1 tablet by mouth every 6 (six) hours as needed for moderate pain. 06/16/15   Wendie Agreste, MD  meloxicam (MOBIC) 7.5 MG tablet Take 1 tablet (7.5 mg total) by mouth daily as needed for pain. 06/16/15   Wendie Agreste, MD   Social History  Social History  . Marital Status: Single    Spouse Name: N/A  . Number of Children: N/A  . Years of Education: N/A   Occupational History  . Not on file.   Social History Main Topics  . Smoking status: Never Smoker   . Smokeless tobacco: Never Used  . Alcohol Use: No  . Drug Use: No  . Sexual Activity: Not on file   Other Topics Concern  . Not on file   Social History Narrative    Review of Systems  Constitutional: Negative for fever and chills.  Genitourinary: Positive for testicular pain. Negative for discharge, scrotal swelling and penile pain.       No loss of bowel or bladder control  Musculoskeletal: Positive for back pain. Negative for  joint swelling and gait problem.  Neurological: Positive for weakness (Left leg).      Objective:   Physical Exam  Constitutional: He is oriented to person, place, and time. He appears well-developed and well-nourished. No distress.  HENT:  Head: Normocephalic and atraumatic.  Eyes: Pupils are equal, round, and reactive to light.  Neck: Neck supple.  Cardiovascular: Normal rate.   Pulmonary/Chest: Effort normal. No respiratory distress.  Genitourinary:  No hernia, testicle and epididymus non tender.   Musculoskeletal: Normal range of motion.  Able to heel toe walk without difficulty. Left lateral flexion to 90 degrees. Equal rotation. Minimal paraspinal spasm approximately L4-5, with associated mild tenderness. Achilles and patella reflexes 2+. Negative babinski, Negative seated straight leg raise.  Neurological: He is alert and oriented to person, place, and time. Coordination normal.  Skin: Skin is warm and dry. He is not diaphoretic.  Psychiatric: He has a normal mood and affect. His behavior is normal.  Nursing note and vitals reviewed.   Filed Vitals:   06/16/15 1314  BP: 114/74  Pulse: 68  Temp: 98.1 F (36.7 C)  TempSrc: Oral  Resp: 16  Height: 6' 0.5" (1.842 m)  Weight: 133 lb (60.328 kg)  SpO2: 96%      Assessment & Plan:   Richard Beasley is a 19 y.o. male Bilateral low back pain, with sciatica presence unspecified - Plan: cyclobenzaprine (FLEXERIL) 5 MG tablet, meloxicam (MOBIC) 7.5 MG tablet, HYDROcodone-acetaminophen (NORCO/VICODIN) 5-325 MG tablet  Repetitive strain injury of lower back, initial encounter - Plan: cyclobenzaprine (FLEXERIL) 5 MG tablet, meloxicam (MOBIC) 7.5 MG tablet, HYDROcodone-acetaminophen (NORCO/VICODIN) 5-325 MG tablet  Suspected mechanical LBP/strain with hx of strain prior. No red flags on exam, and not definitively sciatica by exam in office.   -trial of mobic (if OK with GI), flexeril tid as needed (SED), and hydrocodone for  breakthrough pain. If not improving in next week, consider repeated imaging. Sooner if worse. HEP by handout.   Correct lifting technique was discussed/demonstrated.   rtc precautions.   Meds ordered this encounter  Medications  . cyclobenzaprine (FLEXERIL) 5 MG tablet    Sig: Take 1-2 tablets (5-10 mg total) by mouth 3 (three) times daily as needed.    Dispense:  15 tablet    Refill:  0  . meloxicam (MOBIC) 7.5 MG tablet    Sig: Take 1 tablet (7.5 mg total) by mouth daily as needed for pain.    Dispense:  30 tablet    Refill:  0  . HYDROcodone-acetaminophen (NORCO/VICODIN) 5-325 MG tablet    Sig: Take 1 tablet by mouth every 6 (six) hours as needed for moderate pain.    Dispense:  10 tablet    Refill:  0   Patient Instructions  Correct lifting technique as discussed, exercises below. meloxicam once during the day as needed (avoid taking other NSAIDS like Alleve or Ibuprofen while taking this, and make sure it is ok to take with your Crohn's) and then flexeril if needed at bedtime for muscle spasm. Hydrocodone only if needed for breakthrough pain. Both hydrocodone and flexeril cause sedation, so should not drive or operate heavy machinery while taking these medicines. Return in next week if not improving.   Return to the clinic or go to the nearest emergency room if any of your symptoms worsen or new symptoms occur.   Low Back Strain With Rehab A strain is an injury in which a tendon or muscle is torn. The muscles and tendons of the lower back are vulnerable to strains. However, these muscles and tendons are very strong and require a great force to be injured. Strains are classified into three categories. Grade 1 strains cause pain, but the tendon is not lengthened. Grade 2 strains include a lengthened ligament, due to the ligament being stretched or partially ruptured. With grade 2 strains there is still function, although the function may be decreased. Grade 3 strains involve a  complete tear of the tendon or muscle, and function is usually impaired. SYMPTOMS   Pain in the lower back.  Pain that affects one side more than the other.  Pain that gets worse with movement and may be felt in the hip, buttocks, or back of the thigh.  Muscle spasms of the muscles in the back.  Swelling along the muscles of the back.  Loss of strength of the back muscles.  Crackling sound (crepitation) when the muscles are touched. CAUSES  Lower back strains occur when a force is placed on the muscles or tendons that is greater than they can handle. Common causes of injury include:  Prolonged overuse of the muscle-tendon units in the lower back, usually from incorrect posture.  A single violent injury or force applied to the back. RISK INCREASES WITH:  Sports that involve twisting forces on the spine or a lot of bending at the waist (football, rugby, weightlifting, bowling, golf, tennis, speed skating, racquetball, swimming, running, gymnastics, diving).  Poor strength and flexibility.  Failure to warm up properly before activity.  Family history of lower back pain or disk disorders.  Previous back injury or surgery (especially fusion).  Poor posture with lifting, especially heavy objects.  Prolonged sitting, especially with poor posture. PREVENTION   Learn and use proper posture when sitting or lifting (maintain proper posture when sitting, lift using the knees and legs, not at the waist).  Warm up and stretch properly before activity.  Allow for adequate recovery between workouts.  Maintain physical fitness:  Strength, flexibility, and endurance.  Cardiovascular fitness. PROGNOSIS  If treated properly, lower back strains usually heal within 6 weeks. RELATED COMPLICATIONS   Recurring symptoms, resulting in a chronic problem.  Chronic inflammation, scarring, and partial muscle-tendon tear.  Delayed healing or resolution of symptoms.  Prolonged  disability. TREATMENT  Treatment first involves the use of ice and medicine, to reduce pain and inflammation. The use of strengthening and stretching exercises may help reduce pain with activity. These exercises may be performed at home or with a therapist. Severe injuries may require referral to a therapist for further evaluation and treatment, such as ultrasound. Your caregiver may advise that you wear a back brace or corset, to help reduce pain and discomfort. Often, prolonged bed rest  results in greater harm then benefit. Corticosteroid injections may be recommended. However, these should be reserved for the most serious cases. It is important to avoid using your back when lifting objects. At night, sleep on your back on a firm mattress with a pillow placed under your knees. If non-surgical treatment is unsuccessful, surgery may be needed.  MEDICATION   If pain medicine is needed, nonsteroidal anti-inflammatory medicines (aspirin and ibuprofen), or other minor pain relievers (acetaminophen), are often advised.  Do not take pain medicine for 7 days before surgery.  Prescription pain relievers may be given, if your caregiver thinks they are needed. Use only as directed and only as much as you need.  Ointments applied to the skin may be helpful.  Corticosteroid injections may be given by your caregiver. These injections should be reserved for the most serious cases, because they may only be given a certain number of times. HEAT AND COLD  Cold treatment (icing) should be applied for 10 to 15 minutes every 2 to 3 hours for inflammation and pain, and immediately after activity that aggravates your symptoms. Use ice packs or an ice massage.  Heat treatment may be used before performing stretching and strengthening activities prescribed by your caregiver, physical therapist, or athletic trainer. Use a heat pack or a warm water soak. SEEK MEDICAL CARE IF:   Symptoms get worse or do not improve in 2  to 4 weeks, despite treatment.  You develop numbness, weakness, or loss of bowel or bladder function.  New, unexplained symptoms develop. (Drugs used in treatment may produce side effects.) EXERCISES  RANGE OF MOTION (ROM) AND STRETCHING EXERCISES - Low Back Strain Most people with lower back pain will find that their symptoms get worse with excessive bending forward (flexion) or arching at the lower back (extension). The exercises which will help resolve your symptoms will focus on the opposite motion.  Your physician, physical therapist or athletic trainer will help you determine which exercises will be most helpful to resolve your lower back pain. Do not complete any exercises without first consulting with your caregiver. Discontinue any exercises which make your symptoms worse until you speak to your caregiver.  If you have pain, numbness or tingling which travels down into your buttocks, leg or foot, the goal of the therapy is for these symptoms to move closer to your back and eventually resolve. Sometimes, these leg symptoms will get better, but your lower back pain may worsen. This is typically an indication of progress in your rehabilitation. Be very alert to any changes in your symptoms and the activities in which you participated in the 24 hours prior to the change. Sharing this information with your caregiver will allow him/her to most efficiently treat your condition.  These exercises may help you when beginning to rehabilitate your injury. Your symptoms may resolve with or without further involvement from your physician, physical therapist or athletic trainer. While completing these exercises, remember:  Restoring tissue flexibility helps normal motion to return to the joints. This allows healthier, less painful movement and activity.  An effective stretch should be held for at least 30 seconds.  A stretch should never be painful. You should only feel a gentle lengthening or release in  the stretched tissue. FLEXION RANGE OF MOTION AND STRETCHING EXERCISES: STRETCH - Flexion, Single Knee to Chest   Lie on a firm bed or floor with both legs extended in front of you.  Keeping one leg in contact with the floor, bring your  opposite knee to your chest. Hold your leg in place by either grabbing behind your thigh or at your knee.  Pull until you feel a gentle stretch in your lower back. Hold __________ seconds.  Slowly release your grasp and repeat the exercise with the opposite side. Repeat __________ times. Complete this exercise __________ times per day.  STRETCH - Flexion, Double Knee to Chest   Lie on a firm bed or floor with both legs extended in front of you.  Keeping one leg in contact with the floor, bring your opposite knee to your chest.  Tense your stomach muscles to support your back and then lift your other knee to your chest. Hold your legs in place by either grabbing behind your thighs or at your knees.  Pull both knees toward your chest until you feel a gentle stretch in your lower back. Hold __________ seconds.  Tense your stomach muscles and slowly return one leg at a time to the floor. Repeat __________ times. Complete this exercise __________ times per day.  STRETCH - Low Trunk Rotation  Lie on a firm bed or floor. Keeping your legs in front of you, bend your knees so they are both pointed toward the ceiling and your feet are flat on the floor.  Extend your arms out to the side. This will stabilize your upper body by keeping your shoulders in contact with the floor.  Gently and slowly drop both knees together to one side until you feel a gentle stretch in your lower back. Hold for __________ seconds.  Tense your stomach muscles to support your lower back as you bring your knees back to the starting position. Repeat the exercise to the other side. Repeat __________ times. Complete this exercise __________ times per day  EXTENSION RANGE OF MOTION AND  FLEXIBILITY EXERCISES: STRETCH - Extension, Prone on Elbows   Lie on your stomach on the floor, a bed will be too soft. Place your palms about shoulder width apart and at the height of your head.  Place your elbows under your shoulders. If this is too painful, stack pillows under your chest.  Allow your body to relax so that your hips drop lower and make contact more completely with the floor.  Hold this position for __________ seconds.  Slowly return to lying flat on the floor. Repeat __________ times. Complete this exercise __________ times per day.  RANGE OF MOTION - Extension, Prone Press Ups  Lie on your stomach on the floor, a bed will be too soft. Place your palms about shoulder width apart and at the height of your head.  Keeping your back as relaxed as possible, slowly straighten your elbows while keeping your hips on the floor. You may adjust the placement of your hands to maximize your comfort. As you gain motion, your hands will come more underneath your shoulders.  Hold this position __________ seconds.  Slowly return to lying flat on the floor. Repeat __________ times. Complete this exercise __________ times per day.  RANGE OF MOTION- Quadruped, Neutral Spine   Assume a hands and knees position on a firm surface. Keep your hands under your shoulders and your knees under your hips. You may place padding under your knees for comfort.  Drop your head and point your tail bone toward the ground below you. This will round out your lower back like an angry cat. Hold this position for __________ seconds.  Slowly lift your head and release your tail bone so that your back  sags into a large arch, like an old horse.  Hold this position for __________ seconds.  Repeat this until you feel limber in your lower back.  Now, find your "sweet spot." This will be the most comfortable position somewhere between the two previous positions. This is your neutral spine. Once you have found  this position, tense your stomach muscles to support your lower back.  Hold this position for __________ seconds. Repeat __________ times. Complete this exercise __________ times per day.  STRENGTHENING EXERCISES - Low Back Strain These exercises may help you when beginning to rehabilitate your injury. These exercises should be done near your "sweet spot." This is the neutral, low-back arch, somewhere between fully rounded and fully arched, that is your least painful position. When performed in this safe range of motion, these exercises can be used for people who have either a flexion or extension based injury. These exercises may resolve your symptoms with or without further involvement from your physician, physical therapist or athletic trainer. While completing these exercises, remember:   Muscles can gain both the endurance and the strength needed for everyday activities through controlled exercises.  Complete these exercises as instructed by your physician, physical therapist or athletic trainer. Increase the resistance and repetitions only as guided.  You may experience muscle soreness or fatigue, but the pain or discomfort you are trying to eliminate should never worsen during these exercises. If this pain does worsen, stop and make certain you are following the directions exactly. If the pain is still present after adjustments, discontinue the exercise until you can discuss the trouble with your caregiver. STRENGTHENING - Deep Abdominals, Pelvic Tilt  Lie on a firm bed or floor. Keeping your legs in front of you, bend your knees so they are both pointed toward the ceiling and your feet are flat on the floor.  Tense your lower abdominal muscles to press your lower back into the floor. This motion will rotate your pelvis so that your tail bone is scooping upwards rather than pointing at your feet or into the floor.  With a gentle tension and even breathing, hold this position for __________  seconds. Repeat __________ times. Complete this exercise __________ times per day.  STRENGTHENING - Abdominals, Crunches   Lie on a firm bed or floor. Keeping your legs in front of you, bend your knees so they are both pointed toward the ceiling and your feet are flat on the floor. Cross your arms over your chest.  Slightly tip your chin down without bending your neck.  Tense your abdominals and slowly lift your trunk high enough to just clear your shoulder blades. Lifting higher can put excessive stress on the lower back and does not further strengthen your abdominal muscles.  Control your return to the starting position. Repeat __________ times. Complete this exercise __________ times per day.  STRENGTHENING - Quadruped, Opposite UE/LE Lift   Assume a hands and knees position on a firm surface. Keep your hands under your shoulders and your knees under your hips. You may place padding under your knees for comfort.  Find your neutral spine and gently tense your abdominal muscles so that you can maintain this position. Your shoulders and hips should form a rectangle that is parallel with the floor and is not twisted.  Keeping your trunk steady, lift your right hand no higher than your shoulder and then your left leg no higher than your hip. Make sure you are not holding your breath. Hold this position  __________ seconds.  Continuing to keep your abdominal muscles tense and your back steady, slowly return to your starting position. Repeat with the opposite arm and leg. Repeat __________ times. Complete this exercise __________ times per day.  STRENGTHENING - Lower Abdominals, Double Knee Lift  Lie on a firm bed or floor. Keeping your legs in front of you, bend your knees so they are both pointed toward the ceiling and your feet are flat on the floor.  Tense your abdominal muscles to brace your lower back and slowly lift both of your knees until they come over your hips. Be certain not to hold  your breath.  Hold __________ seconds. Using your abdominal muscles, return to the starting position in a slow and controlled manner. Repeat __________ times. Complete this exercise __________ times per day.  POSTURE AND BODY MECHANICS CONSIDERATIONS - Low Back Strain Keeping correct posture when sitting, standing or completing your activities will reduce the stress put on different body tissues, allowing injured tissues a chance to heal and limiting painful experiences. The following are general guidelines for improved posture. Your physician or physical therapist will provide you with any instructions specific to your needs. While reading these guidelines, remember:  The exercises prescribed by your provider will help you have the flexibility and strength to maintain correct postures.  The correct posture provides the best environment for your joints to work. All of your joints have less wear and tear when properly supported by a spine with good posture. This means you will experience a healthier, less painful body.  Correct posture must be practiced with all of your activities, especially prolonged sitting and standing. Correct posture is as important when doing repetitive low-stress activities (typing) as it is when doing a single heavy-load activity (lifting). RESTING POSITIONS Consider which positions are most painful for you when choosing a resting position. If you have pain with flexion-based activities (sitting, bending, stooping, squatting), choose a position that allows you to rest in a less flexed posture. You would want to avoid curling into a fetal position on your side. If your pain worsens with extension-based activities (prolonged standing, working overhead), avoid resting in an extended position such as sleeping on your stomach. Most people will find more comfort when they rest with their spine in a more neutral position, neither too rounded nor too arched. Lying on a non-sagging bed  on your side with a pillow between your knees, or on your back with a pillow under your knees will often provide some relief. Keep in mind, being in any one position for a prolonged period of time, no matter how correct your posture, can still lead to stiffness. PROPER SITTING POSTURE In order to minimize stress and discomfort on your spine, you must sit with correct posture. Sitting with good posture should be effortless for a healthy body. Returning to good posture is a gradual process. Many people can work toward this most comfortably by using various supports until they have the flexibility and strength to maintain this posture on their own. When sitting with proper posture, your ears will fall over your shoulders and your shoulders will fall over your hips. You should use the back of the chair to support your upper back. Your lower back will be in a neutral position, just slightly arched. You may place a small pillow or folded towel at the base of your lower back for support.  When working at a desk, create an environment that supports good, upright posture. Without extra  support, muscles tire, which leads to excessive strain on joints and other tissues. Keep these recommendations in mind: CHAIR:  A chair should be able to slide under your desk when your back makes contact with the back of the chair. This allows you to work closely.  The chair's height should allow your eyes to be level with the upper part of your monitor and your hands to be slightly lower than your elbows. BODY POSITION  Your feet should make contact with the floor. If this is not possible, use a foot rest.  Keep your ears over your shoulders. This will reduce stress on your neck and lower back. INCORRECT SITTING POSTURES  If you are feeling tired and unable to assume a healthy sitting posture, do not slouch or slump. This puts excessive strain on your back tissues, causing more damage and pain. Healthier options  include:  Using more support, like a lumbar pillow.  Switching tasks to something that requires you to be upright or walking.  Talking a brief walk.  Lying down to rest in a neutral-spine position. PROLONGED STANDING WHILE SLIGHTLY LEANING FORWARD  When completing a task that requires you to lean forward while standing in one place for a long time, place either foot up on a stationary 2-4 inch high object to help maintain the best posture. When both feet are on the ground, the lower back tends to lose its slight inward curve. If this curve flattens (or becomes too large), then the back and your other joints will experience too much stress, tire more quickly, and can cause pain. CORRECT STANDING POSTURES Proper standing posture should be assumed with all daily activities, even if they only take a few moments, like when brushing your teeth. As in sitting, your ears should fall over your shoulders and your shoulders should fall over your hips. You should keep a slight tension in your abdominal muscles to brace your spine. Your tailbone should point down to the ground, not behind your body, resulting in an over-extended swayback posture.  INCORRECT STANDING POSTURES  Common incorrect standing postures include a forward head, locked knees and/or an excessive swayback. WALKING Walk with an upright posture. Your ears, shoulders and hips should all line-up. PROLONGED ACTIVITY IN A FLEXED POSITION When completing a task that requires you to bend forward at your waist or lean over a low surface, try to find a way to stabilize 3 out of 4 of your limbs. You can place a hand or elbow on your thigh or rest a knee on the surface you are reaching across. This will provide you more stability so that your muscles do not fatigue as quickly. By keeping your knees relaxed, or slightly bent, you will also reduce stress across your lower back. CORRECT LIFTING TECHNIQUES DO :   Assume a wide stance. This will provide  you more stability and the opportunity to get as close as possible to the object which you are lifting.  Tense your abdominals to brace your spine. Bend at the knees and hips. Keeping your back locked in a neutral-spine position, lift using your leg muscles. Lift with your legs, keeping your back straight.  Test the weight of unknown objects before attempting to lift them.  Try to keep your elbows locked down at your sides in order get the best strength from your shoulders when carrying an object.  Always ask for help when lifting heavy or awkward objects. INCORRECT LIFTING TECHNIQUES DO NOT:   Lock your knees  when lifting, even if it is a small object.  Bend and twist. Pivot at your feet or move your feet when needing to change directions.  Assume that you can safely pick up even a paper clip without proper posture.   This information is not intended to replace advice given to you by your health care provider. Make sure you discuss any questions you have with your health care provider.   Document Released: 08/15/2005 Document Revised: 09/05/2014 Document Reviewed: 11/27/2008 Elsevier Interactive Patient Education [employer].        I personally performed the services described in this documentation, which was scribed in my presence. The recorded information has been reviewed and considered, and addended by me as needed.   By signing my name below, I, Judithe Modest, attest that this documentation has been prepared under the direction and in the presence of Merri Ray, MD. Electronically Signed: Judithe Modest, ER Scribe. 06/16/2015. 2:56 PM.

## 2015-07-02 ENCOUNTER — Ambulatory Visit (INDEPENDENT_AMBULATORY_CARE_PROVIDER_SITE_OTHER): Payer: 59 | Admitting: Emergency Medicine

## 2015-07-02 ENCOUNTER — Ambulatory Visit (INDEPENDENT_AMBULATORY_CARE_PROVIDER_SITE_OTHER): Payer: 59

## 2015-07-02 VITALS — BP 130/80 | HR 63 | Temp 98.1°F | Resp 16 | Ht 72.5 in | Wt 136.0 lb

## 2015-07-02 DIAGNOSIS — M79644 Pain in right finger(s): Secondary | ICD-10-CM

## 2015-07-02 DIAGNOSIS — S6991XA Unspecified injury of right wrist, hand and finger(s), initial encounter: Secondary | ICD-10-CM

## 2015-07-02 DIAGNOSIS — S63252A Unspecified dislocation of right middle finger, initial encounter: Secondary | ICD-10-CM | POA: Diagnosis not present

## 2015-07-02 NOTE — Progress Notes (Signed)
Subjective:  Patient ID: Richard Beasley, male    DOB: Jun 05, 1996  Age: 19 y.o. MRN: [id number]  CC: Finger Injury   HPI Richard Beasley presents   With an injury to his right third finger. He was playing ball an injured his hand about 15 minutes prior to showing up in the office.  He has a visible deformity third finger PIP joint. He has moderate pain in his hand.  He denies taking any medication for pain. No other complaint  History Richard Beasley has a past medical history of Fatigue (01/19/2011); Generalized headaches (01/19/2011); Pilonidal cyst (01/19/2011); Nausea & vomiting (01/19/2011); and IBS (irritable bowel syndrome) (01/19/2011).   He has past surgical history that includes Hydrocele excision / repair (2009); Elbow fracture surgery (2000); and Fracture surgery (2000).   His  family history is not on file.  He   reports that he has never smoked. He has never used smokeless tobacco. He reports that he does not drink alcohol or use illicit drugs.  Outpatient Prescriptions Prior to Visit  Medication Sig Dispense Refill  . azaTHIOprine (IMURAN) 50 MG tablet Take 100 mg by mouth daily.     . colestipol (COLESTID) 1 G tablet Take 1 g by mouth daily.     . Vitamin D, Ergocalciferol, (DRISDOL) 50000 UNITS CAPS capsule Take 1 capsule by mouth once a week.  0  . cyclobenzaprine (FLEXERIL) 5 MG tablet Take 1-2 tablets (5-10 mg total) by mouth 3 (three) times daily as needed. 15 tablet 0  . HYDROcodone-acetaminophen (NORCO/VICODIN) 5-325 MG tablet Take 1 tablet by mouth every 6 (six) hours as needed for moderate pain. 10 tablet 0  . meloxicam (MOBIC) 7.5 MG tablet Take 1 tablet (7.5 mg total) by mouth daily as needed for pain. 30 tablet 0   No facility-administered medications prior to visit.    Social History   Social History  . Marital Status: Single    Spouse Name: N/A  . Number of Children: N/A  . Years of Education: N/A   Social History Main Topics  . Smoking status: Never Smoker     . Smokeless tobacco: Never Used  . Alcohol Use: No  . Drug Use: No  . Sexual Activity: Not Asked   Other Topics Concern  . None   Social History Narrative     Review of Systems  Constitutional: Negative for fever, chills and appetite change.  HENT: Negative for congestion, ear pain, postnasal drip, sinus pressure and sore throat.   Eyes: Negative for pain and redness.  Respiratory: Negative for cough, shortness of breath and wheezing.   Cardiovascular: Negative for leg swelling.  Gastrointestinal: Negative for nausea, vomiting, abdominal pain, diarrhea, constipation and blood in stool.  Endocrine: Negative for polyuria.  Genitourinary: Negative for dysuria, urgency, frequency and flank pain.  Musculoskeletal: Negative for gait problem.  Skin: Negative for rash.  Neurological: Negative for weakness and headaches.  Psychiatric/Behavioral: Negative for confusion and decreased concentration. The patient is not nervous/anxious.     Objective:  BP 130/80 mmHg  Pulse 63  Temp(Src) 98.1 F (36.7 C) (Oral)  Resp 16  Ht 6' 0.5" (1.842 m)  Wt 136 lb (61.689 kg)  BMI 18.18 kg/m2  SpO2 96%  Physical Exam  Constitutional: He is oriented to person, place, and time. He appears well-developed and well-nourished.  HENT:  Head: Normocephalic and atraumatic.  Eyes: Conjunctivae are normal. Pupils are equal, round, and reactive to light.  Pulmonary/Chest: Effort normal.  Musculoskeletal: He exhibits no edema.  Right hand: He exhibits decreased range of motion, tenderness and deformity.  Neurological: He is alert and oriented to person, place, and time.  Skin: Skin is dry.  Psychiatric: He has a normal mood and affect. His behavior is normal. Thought content normal.    he has a visible dislocation of the PIP joint third finger. Neurovascular and tendon function is ormal.   Assessment & Plan:   Richard Beasley was seen today for finger injury.  Diagnoses and all orders for this  visit:  Pain of finger of right hand  Finger injury, right, initial encounter -     DG Finger Middle Right; Future  Dislocation of right middle finger, initial encounter   I have discontinued Mr. Fikes cyclobenzaprine, meloxicam, and HYDROcodone-acetaminophen. I am also having him maintain his azaTHIOprine, colestipol, and Vitamin D (Ergocalciferol).  No orders of the defined types were placed in this encounter.     his finger  wasreduced atraumatically. Post reduction x-ray was normal. Had no evidence of fracture.  He was placed in a finger splint and instructed to follow-up in a  In a week Appropriate red flag conditions were discussed with the patient as well as actions that should be taken.  Patient expressed his understanding.  Follow-up: Return in about 1 week (around 07/09/2015).  Roselee Culver, MD   [facility] reading (PRIMARY) by  Dr. Ouida Sills  Satisfactory post-reduction third IP dislocation.

## 2015-07-02 NOTE — Patient Instructions (Signed)
Finger Dislocation Finger dislocation is the displacement of bones in your finger at the joints. Most commonly, finger dislocation occurs at the proximal interphalangeal joint (the joint closest to your knuckle). Very strong, fibrous tissues (ligaments) and joint capsules connect the three bones of your fingers.  CAUSES Dislocation is caused by a forceful impact. This impact moves these bones off the joint and often tears your ligaments.  SYMPTOMS Symptoms of finger dislocation include:  Deformity of your finger.  Pain, with loss of movement. DIAGNOSIS  Finger dislocation is diagnosed with a physical exam. Often, X-ray exams are done to see if you have associated injuries, such as bone fractures. TREATMENT  Finger dislocations are treated by putting your bones back into position (reduction) either by manually moving the bones back into place or through surgery. Your finger is then kept in a fixed position (immobilized) with the use of a dressing or splint for a brief period. When your ligament has to be surgically repaired, it needs to be kept in a fixed position with a dressing or splint for 1 to 2 weeks. Because joint stiffness is a long-term complication of finger dislocation, hand exercises or physical therapy to increase the range of motion and to regain strength is usually started as soon as the ligament is healed. Exercises and therapy generally last no more than 3 months. HOME CARE INSTRUCTIONS The following measures can help to reduce pain and speed up the healing process:  Rest your injured joint. Do not move until instructed otherwise by your caregiver. Avoid activities similar to the one that caused your injury.  Apply ice to your injured joint for the first day or 2 after your reduction or as directed by your caregiver. Applying ice helps to reduce inflammation and pain.  Put ice in a plastic bag.  Place a towel between your skin and the bag.  Leave the ice on for 15-20 minutes  at a time, every 2 hours while you are awake.  Elevate your hand above your heart as directed by your caregiver to reduce swelling.  Take over-the-counter or prescription medicine for pain as your caregiver instructs you. SEEK IMMEDIATE MEDICAL CARE IF:  Your dressing or splint becomes damaged.  Your pain becomes worse rather than better.  You lose feeling in your finger, or it becomes cold and white. MAKE SURE YOU:  Understand these instructions.  Will watch your condition.  Will get help right away if you are not doing well or get worse.   This information is not intended to replace advice given to you by your health care provider. Make sure you discuss any questions you have with your health care provider.   Document Released: 08/12/2000 Document Revised: 09/05/2014 Document Reviewed: 01/09/2015 Elsevier Interactive Patient Education [employer].

## 2015-09-21 ENCOUNTER — Telehealth: Payer: Self-pay

## 2015-09-21 NOTE — Telephone Encounter (Signed)
Mom called stating pt is still having neck and intermittent testicular pain and is wanting to know what can be done next   Best number [phone]

## 2015-09-22 NOTE — Telephone Encounter (Signed)
Recommend he be seen, especially with pain into the testicles. Also agree with ER evaluation if any acute or worsening pain in the testicles.

## 2015-09-22 NOTE — Telephone Encounter (Signed)
Spoke with pt's mom. She states pt is still having trouble with pain in the testicles. He is still having back pain with sciatica on the right leg. He feels like he has been kicked in the testicles. Mom wants to know if he should RTC? (I suggested this) or see a urologist? Order another scan? His scan in the hospital was normal but pt has history of hydrocele. Please advise.  Advised mom to RTC or ED if pt gets worse while waiting for a response from Dr. Carlota Raspberry.

## 2015-09-23 ENCOUNTER — Ambulatory Visit (INDEPENDENT_AMBULATORY_CARE_PROVIDER_SITE_OTHER): Payer: BLUE CROSS/BLUE SHIELD | Admitting: Family Medicine

## 2015-09-23 VITALS — BP 112/86 | HR 77 | Temp 98.1°F | Resp 18 | Ht 72.5 in | Wt 141.5 lb

## 2015-09-23 DIAGNOSIS — M5441 Lumbago with sciatica, right side: Secondary | ICD-10-CM

## 2015-09-23 DIAGNOSIS — M5442 Lumbago with sciatica, left side: Secondary | ICD-10-CM

## 2015-09-23 DIAGNOSIS — N50819 Testicular pain, unspecified: Secondary | ICD-10-CM

## 2015-09-23 MED ORDER — MELOXICAM 7.5 MG PO TABS: 7.5000 mg | ORAL_TABLET | Freq: Every day | ORAL | Status: DC

## 2015-09-23 MED ORDER — CYCLOBENZAPRINE HCL 5 MG PO TABS: ORAL_TABLET | ORAL | Status: DC

## 2015-09-23 NOTE — Patient Instructions (Signed)
I will order the MRI, but also refer you to Dr. Lynann Bologna (back specialist) to determine further workup of your symptoms. If testicular pain worsens, or swelling, be seen immediately here or in emergency room. If the MRI does not indicate a reason for your testicular pain, would recommend follow-up with urologist.  Let me know if there are questions in the meantime. Return to the clinic or go to the nearest emergency room if any of your symptoms worsen or new symptoms occur.

## 2015-09-23 NOTE — Telephone Encounter (Signed)
Spoke with pt's mom. Advised pt to RTC.

## 2015-09-23 NOTE — Progress Notes (Signed)
Subjective:  By signing my name below, I, [name], attest that this documentation has been prepared under the direction and in the presence of Merri Ray, MD. Electronically Signed: [name], [place]. 09/23/2015 , 7:54 PM .  Patient was seen in Room 9 .   Patient ID: Richard Beasley, male    DOB: 07-Feb-1996, 20 y.o.   MRN: [id number] Chief Complaint  Patient presents with  . Back Pain    x 6 months   HPI Richard Beasley is a 20 y.o. male Pt is here complaining of back pain on going for the past 6 months. He has history of Crohn's disease, followed by Dr. Alfredia Ferguson at [facility] and Dr. Lovett Sox. He also has a history of hydrocele with cyst removal 2008 with [facility].   Last visit with me was in Oct 2016 for low back pain. His job does require lifting and he did notice sciatic symptoms at that visit. Also did discuss testicular pain at that time as well. He states it had coincided with back pain in the past. He had a scrotal ultrasound that was negative, done on Oct. 6th, 2016 in the ED. He planned on being followed by Dr. Pilar Jarvis for his testicular pain with urology on Nov 9th. When I last treated him for his back pain on Oct. 18th, he was given home exercise programs, flexeril, mobic, and hydrocodone if needed for breakthrough pain. He was advised to return in 1 week if not improved for imaging. See telephone note recently regarding back pain and intermittent testicular pain.   Last lumbar spine xray complete LS-spine: normal, done Apr 26, 2015.   He denies following up with the urologist after his last visit with me. He still notes having intermittent testicular pain and now the pain has spread into his right inner thigh. He feels that the pain comes back more when he bends over to pick things up while at work. His current work at [employer] has less lifting than his previous job with [employer]. He informs that he has more pain in his testicles than his back pain. He  denies taking any medications for this. He denies penile discharge, testicular swelling, urinary incontinence, and bowel incontinence. He's still able to achieve erections. He's currently not sexually active. He hasn't seen a back specialist nor seeing physical therapist. He's able to do planks exercise.   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, stocking frozen foods.   Patient Active Problem List   Diagnosis Date Noted  . Hydrocele, left 01/23/2015  . Crohn's disease ([place]) 01/23/2015  . CD (Crohn's disease) ([place]) 01/23/2015  . Hydrocele 01/23/2015  . Intestinal obstruction ([place]) 06/24/2011   Past Medical History  Diagnosis Date  . Fatigue 01/19/2011  . Generalized headaches 01/19/2011  . Pilonidal cyst 01/19/2011  . Nausea & vomiting 01/19/2011  . IBS (irritable bowel syndrome) 01/19/2011   Past Surgical History  Procedure Laterality Date  . Hydrocele excision / repair  2009  . Elbow fracture surgery  2000  . Fracture surgery  2000    Elbow   Allergies  Allergen Reactions  . Ceclor [Cefaclor]    Prior to Admission medications   Medication Sig Start Date End Date Taking? Authorizing Provider  azaTHIOprine (IMURAN) 50 MG tablet Take 100 mg by mouth daily.  07/04/14  Yes Historical Provider, MD  colestipol (COLESTID) 1 G tablet Take 1 g by mouth daily.  12/12/14 12/12/15 Yes Historical Provider, MD  Vitamin D, Ergocalciferol, (  DRISDOL) 50000 UNITS CAPS capsule Take 1 capsule by mouth once a week. Reported on 09/23/2015 05/09/15   Historical Provider, MD   Social History   Social History  . Marital Status: Single    Spouse Name: N/A  . Number of Children: N/A  . Years of Education: N/A   Occupational History  . Not on file.   Social History Main Topics  . Smoking status: Never Smoker   . Smokeless tobacco: Never Used  . Alcohol Use: No  . Drug Use: No  . Sexual Activity: Not on file   Other Topics Concern  . Not on file   Social History Narrative   Review of Systems    Constitutional: Negative for fever and fatigue.  Genitourinary: Positive for testicular pain. Negative for dysuria, discharge, penile swelling, scrotal swelling and penile pain.  Musculoskeletal: Positive for back pain. Negative for myalgias, arthralgias, gait problem, neck pain and neck stiffness.  Skin: Negative for rash.       Objective:   Physical Exam  Constitutional: He is oriented to person, place, and time. He appears well-developed and well-nourished. No distress.  HENT:  Head: Normocephalic and atraumatic.  Eyes: EOM are normal. Pupils are equal, round, and reactive to light.  Neck: Neck supple.  Cardiovascular: Normal rate.   Pulmonary/Chest: Effort normal. No respiratory distress.  Genitourinary:  Testicles nontender during exam, no nodules were palpated, no penile discharge, no hernia  Musculoskeletal: Normal range of motion.  Able to heel-toe walk, back full ROM pain free, lumbar spine non tender, negative seated straight leg raise  Neurological: He is alert and oriented to person, place, and time.  Reflex Scores:      Patellar reflexes are 2+ on the right side and 2+ on the left side.      Achilles reflexes are 2+ on the right side and 2+ on the left side. Skin: Skin is warm and dry.  Psychiatric: He has a normal mood and affect. His behavior is normal.  Nursing note and vitals reviewed.   Filed Vitals:   09/23/15 1850  BP: 112/86  Pulse: 77  Temp: 98.1 F (36.7 C)  TempSrc: Oral  Resp: 18  Height: 6' 0.5" (1.842 m)  Weight: 141 lb 8 oz (64.184 kg)  SpO2: 98%      Assessment & Plan:    Richard Beasley is a 20 y.o. male Bilateral low back pain with sciatica, sciatica laterality unspecified - Plan: Ambulatory referral to Orthopedic Surgery, cyclobenzaprine (FLEXERIL) 5 MG tablet, meloxicam (MOBIC) 7.5 MG tablet, MR Lumbar Spine Wo Contrast  Testicular pain, unspecified - Plan: Ambulatory referral to Orthopedic Surgery, cyclobenzaprine (FLEXERIL) 5 MG  tablet, meloxicam (MOBIC) 7.5 MG tablet, MR Lumbar Spine Wo Contrast   - recurrent low back pain with sciatica symptoms, and radiation to testicles bilaterally. No concerning findings on testicular exam in office, no red flags on history, but with recurrent nature and his age, will check MRI of lumbar spine, refer to orthopedics for evaluation, and if no cause of testicular pain on MRI, would recommend follow-up with urologist as planned previously.  Meds ordered this encounter  Medications  . cyclobenzaprine (FLEXERIL) 5 MG tablet    Sig: 1 pill by mouth up to every 8 hours as needed. Start with one pill by mouth each bedtime as needed due to sedation    Dispense:  15 tablet    Refill:  0  . meloxicam (MOBIC) 7.5 MG tablet    Sig: Take 1 tablet (  7.5 mg total) by mouth daily.    Dispense:  30 tablet    Refill:  0   Patient Instructions  I will order the MRI, but also refer you to Dr. Lynann Bologna (back specialist) to determine further workup of your symptoms. If testicular pain worsens, or swelling, be seen immediately here or in emergency room. If the MRI does not indicate a reason for your testicular pain, would recommend follow-up with urologist.  Let me know if there are questions in the meantime. Return to the clinic or go to the nearest emergency room if any of your symptoms worsen or new symptoms occur.     I personally performed the services described in this documentation, which was scribed in my presence. The recorded information has been reviewed and considered, and addended by me as needed.

## 2015-12-10 DIAGNOSIS — N509 Disorder of male genital organs, unspecified: Secondary | ICD-10-CM | POA: Diagnosis not present

## 2015-12-10 DIAGNOSIS — R1030 Lower abdominal pain, unspecified: Secondary | ICD-10-CM | POA: Diagnosis not present

## 2015-12-10 DIAGNOSIS — M545 Low back pain: Secondary | ICD-10-CM | POA: Diagnosis not present

## 2015-12-10 DIAGNOSIS — M62838 Other muscle spasm: Secondary | ICD-10-CM | POA: Diagnosis not present

## 2016-01-14 DIAGNOSIS — K6389 Other specified diseases of intestine: Secondary | ICD-10-CM | POA: Diagnosis not present

## 2016-03-15 ENCOUNTER — Telehealth: Payer: Self-pay

## 2016-03-15 ENCOUNTER — Ambulatory Visit (INDEPENDENT_AMBULATORY_CARE_PROVIDER_SITE_OTHER): Payer: BLUE CROSS/BLUE SHIELD | Admitting: Urgent Care

## 2016-03-15 ENCOUNTER — Ambulatory Visit (INDEPENDENT_AMBULATORY_CARE_PROVIDER_SITE_OTHER): Payer: BLUE CROSS/BLUE SHIELD

## 2016-03-15 VITALS — BP 122/84 | HR 79 | Temp 98.4°F | Resp 18 | Ht 73.0 in | Wt 144.0 lb

## 2016-03-15 DIAGNOSIS — S92352A Displaced fracture of fifth metatarsal bone, left foot, initial encounter for closed fracture: Secondary | ICD-10-CM | POA: Diagnosis not present

## 2016-03-15 DIAGNOSIS — S93402A Sprain of unspecified ligament of left ankle, initial encounter: Secondary | ICD-10-CM | POA: Diagnosis not present

## 2016-03-15 DIAGNOSIS — S92302A Fracture of unspecified metatarsal bone(s), left foot, initial encounter for closed fracture: Secondary | ICD-10-CM | POA: Diagnosis not present

## 2016-03-15 DIAGNOSIS — S99912A Unspecified injury of left ankle, initial encounter: Secondary | ICD-10-CM

## 2016-03-15 DIAGNOSIS — M25572 Pain in left ankle and joints of left foot: Secondary | ICD-10-CM

## 2016-03-15 MED ORDER — TRAMADOL HCL 50 MG PO TABS: 50.0000 mg | ORAL_TABLET | Freq: Three times a day (TID) | ORAL | Status: DC | PRN

## 2016-03-15 NOTE — Telephone Encounter (Signed)
Pt's mother called, because we are sending an urgent orthopaedic referral in, they are also going to need copies of the xray.  Please prepare the left ankle xrays asap.  Call mom at [phone]

## 2016-03-15 NOTE — Telephone Encounter (Signed)
Spoke with pt and informed her that if the orthopedic office they go to is [facility] they will have access to pts x-rays and if it isnt to call back for cd

## 2016-03-15 NOTE — Progress Notes (Signed)
    MRN: [id number] DOB: August 25, 1996  Subjective:   Richard Beasley is a 20 y.o. male presenting for chief complaint of Leg Injury  Reports suffering left ankle injury while playing basketball yesterday. Patient landed on someone's foot after jumping up and rolled his ankle laterally. Has had pain, swelling, bruising since then. He is unable to bear weight. He has iced his ankle and propped it up. Denies numbness or tingling, hearing loud pops, bony deformity.  Richard Beasley has a current medication list which includes the following prescription(s): azathioprine, colestipol, and cyclobenzaprine. Also is allergic to ceclor.  Richard Beasley  has a past medical history of Fatigue (01/19/2011); Generalized headaches (01/19/2011); Pilonidal cyst (01/19/2011); Nausea & vomiting (01/19/2011); and IBS (irritable bowel syndrome) (01/19/2011). Also  has past surgical history that includes Hydrocele excision / repair (2009); Elbow fracture surgery (2000); and Fracture surgery (2000).  Objective:   Vitals: BP 122/84 mmHg  Pulse 79  Temp(Src) 98.4 F (36.9 C) (Oral)  Resp 18  Ht 6' 1"  (1.854 m)  Wt 144 lb (65.318 kg)  BMI 19.00 kg/m2  SpO2 98%  Physical Exam  Constitutional: He is oriented to person, place, and time. He appears well-developed and well-nourished.  Cardiovascular: Normal rate.   Pulmonary/Chest: Effort normal.  Musculoskeletal:       Left ankle: He exhibits decreased range of motion, swelling and ecchymosis. He exhibits no deformity, no laceration and normal pulse. Tenderness. Lateral malleolus, medial malleolus, AITFL, head of 5th metatarsal and proximal fibula tenderness found. No posterior TFL tenderness found. Achilles tendon exhibits no pain and no defect.  Neurological: He is alert and oriented to person, place, and time.  Skin: Skin is warm and dry.   Dg Ankle Complete Left  03/15/2016  CLINICAL DATA:  Injury of the left ankle yesterday while landing wrong and rolling the ankle. Persistent pain  swelling and bruising and inability to bear weight EXAM: LEFT ANKLE COMPLETE - 3+ VIEW COMPARISON:  None in PACs FINDINGS: The bones are adequately mineralized. The malleoli are intact. The ankle joint mortise is preserved. The talar dome is intact. The calcaneus is unremarkable. There is a transversely oriented fracture through the base of the fifth metatarsal. There is diffuse soft tissue swelling greatest anteriorly and laterally. IMPRESSION: 1. Fracture through the base of the fifth metatarsal. 2. No acute malleolar fracture.  No ankle dislocation. 3. Large amount of soft tissue swelling. Electronically Signed   By: David  Martinique M.D.   On: 03/15/2016 11:54    Assessment and Plan :   1. Left ankle injury, initial encounter 2. Left ankle sprain, initial encounter 3. Fracture of fifth metatarsal bone, left, closed, initial encounter 4. Left ankle pain - Reviewed treatment options at length. Will refer urgently to Ortho. Use Tramadol for pain, wear post-op shoe, use crutches to ambulate. Patient is to not bear weight on his left foot. Work note provided for 2 days.   Jaynee Eagles, PA-C [facility] [phone] 03/15/2016 11:32 AM

## 2016-03-15 NOTE — Patient Instructions (Addendum)
Metatarsal Fracture A metatarsal fracture is a break in a metatarsal bone. Metatarsal bones connect your toe bones to your ankle bones. CAUSES This type of fracture may be caused by:  A sudden twisting of your foot.  A fall onto your foot.  Overuse or repetitive exercise. RISK FACTORS This condition is more likely to develop in people who:  Play contact sports.  Have a bone disease.  Have a low calcium level. SYMPTOMS Symptoms of this condition include:  Pain that is worse when walking or standing.  Pain when pressing on the foot or moving the toes.  Swelling.  Bruising on the top or bottom of the foot.  A foot that appears shorter than the other one. DIAGNOSIS This condition is diagnosed with a physical exam. You may also have imaging tests, such as:  X-rays.  A CT scan.  MRI. TREATMENT Treatment for this condition depends on its severity and whether a bone has moved out of place. Treatment may involve:  Rest.  Wearing foot support such as a cast, splint, or boot for several weeks.  Using crutches.  Surgery to move bones back into the right position. Surgery is usually needed if there are many pieces of broken bone or bones that are very out of place (displaced fracture).  Physical therapy. This may be needed to help you regain full movement and strength in your foot. You will need to return to your health care provider to have X-rays taken until your bones heal. Your health care provider will look at the X-rays to make sure that your foot is healing well. HOME CARE INSTRUCTIONS  If You Have a Cast:  Do not stick anything inside the cast to scratch your skin. Doing that increases your risk of infection.  Check the skin around the cast every day. Report any concerns to your health care provider. You may put lotion on dry skin around the edges of the cast. Do not apply lotion to the skin underneath the cast.  Keep the cast clean and dry. If You Have a Splint  or a Supportive Boot:  Wear it as directed by your health care provider. Remove it only as directed by your health care provider.  Loosen it if your toes become numb and tingle, or if they turn cold and blue.  Keep it clean and dry. Bathing  Do not take baths, swim, or use a hot tub until your health care provider approves. Ask your health care provider if you can take showers. You may only be allowed to take sponge baths for bathing.  If your health care provider approves bathing and showering, cover the cast or splint with a watertight plastic bag to protect it from water. Do not let the cast or splint get wet. Managing Pain, Stiffness, and Swelling  If directed, apply ice to the injured area (if you have a splint, not a cast).  Put ice in a plastic bag.  Place a towel between your skin and the bag.  Leave the ice on for 20 minutes, 2-3 times per day.  Move your toes often to avoid stiffness and to lessen swelling.  Raise (elevate) the injured area above the level of your heart while you are sitting or lying down. Driving  Do not drive or operate heavy machinery while taking pain medicine.  Do not drive while wearing foot support on a foot that you use for driving. Activity  Return to your normal activities as directed by your health care  provider. Ask your health care provider what activities are safe for you.  Perform exercises as directed by your health care provider or physical therapist. Safety  Do not use the injured foot to support your body weight until your health care provider says that you can. Use crutches as directed by your health care provider. General Instructions  Do not put pressure on any part of the cast or splint until it is fully hardened. This may take several hours.  Do not use any tobacco products, including cigarettes, chewing tobacco, or e-cigarettes. Tobacco can delay bone healing. If you need help quitting, ask your health care  provider.  Take medicines only as directed by your health care provider.  Keep all follow-up visits as directed by your health care provider. This is important. SEEK MEDICAL CARE IF:  You have a fever.  Your cast, splint, or boot is too loose or too tight.  Your cast, splint, or boot is damaged.  Your pain medicine is not helping.  You have pain, tingling, or numbness in your foot that is not going away. SEEK IMMEDIATE MEDICAL CARE IF:  You have severe pain.  You have tingling or numbness in your foot that is getting worse.  Your foot feels cold or becomes numb.  Your foot changes color.   This information is not intended to replace advice given to you by your health care provider. Make sure you discuss any questions you have with your health care provider.   Document Released: 05/07/2002 Document Revised: 12/30/2014 Document Reviewed: 06/11/2014 Elsevier Interactive Patient Education 2016 [employer].     IF you received an x-ray today, you will receive an invoice from [facility]. Please contact [facility] at [phone] with questions or concerns regarding your invoice.   IF you received labwork today, you will receive an invoice from [employer]. Please contact [facility] at [phone] with questions or concerns regarding your invoice.   Our billing staff will not be able to assist you with questions regarding bills from these companies.  You will be contacted with the lab results as soon as they are available. The fastest way to get your results is to activate your My Chart account. Instructions are located on the last page of this paperwork. If you have not heard from [place] regarding the results in 2 weeks, please contact this office.

## 2016-03-16 DIAGNOSIS — S93402A Sprain of unspecified ligament of left ankle, initial encounter: Secondary | ICD-10-CM | POA: Diagnosis not present

## 2016-03-16 DIAGNOSIS — S9032XA Contusion of left foot, initial encounter: Secondary | ICD-10-CM | POA: Diagnosis not present

## 2016-03-16 DIAGNOSIS — S99812A Other specified injuries of left ankle, initial encounter: Secondary | ICD-10-CM | POA: Diagnosis not present

## 2016-03-31 DIAGNOSIS — S9032XD Contusion of left foot, subsequent encounter: Secondary | ICD-10-CM | POA: Diagnosis not present

## 2016-03-31 DIAGNOSIS — S93402D Sprain of unspecified ligament of left ankle, subsequent encounter: Secondary | ICD-10-CM | POA: Diagnosis not present

## 2016-03-31 DIAGNOSIS — S92355D Nondisplaced fracture of fifth metatarsal bone, left foot, subsequent encounter for fracture with routine healing: Secondary | ICD-10-CM | POA: Diagnosis not present

## 2016-03-31 DIAGNOSIS — M25572 Pain in left ankle and joints of left foot: Secondary | ICD-10-CM | POA: Diagnosis not present

## 2016-04-14 DIAGNOSIS — M25572 Pain in left ankle and joints of left foot: Secondary | ICD-10-CM | POA: Diagnosis not present

## 2016-04-14 DIAGNOSIS — S93402D Sprain of unspecified ligament of left ankle, subsequent encounter: Secondary | ICD-10-CM | POA: Diagnosis not present

## 2016-04-14 DIAGNOSIS — S92355D Nondisplaced fracture of fifth metatarsal bone, left foot, subsequent encounter for fracture with routine healing: Secondary | ICD-10-CM | POA: Diagnosis not present

## 2016-05-09 DIAGNOSIS — K50919 Crohn's disease, unspecified, with unspecified complications: Secondary | ICD-10-CM | POA: Diagnosis not present

## 2016-05-09 DIAGNOSIS — K529 Noninfective gastroenteritis and colitis, unspecified: Secondary | ICD-10-CM | POA: Diagnosis not present

## 2016-09-21 DIAGNOSIS — K529 Noninfective gastroenteritis and colitis, unspecified: Secondary | ICD-10-CM | POA: Diagnosis not present

## 2017-01-19 DIAGNOSIS — K50919 Crohn's disease, unspecified, with unspecified complications: Secondary | ICD-10-CM | POA: Diagnosis not present

## 2017-03-17 DIAGNOSIS — K50012 Crohn's disease of small intestine with intestinal obstruction: Secondary | ICD-10-CM | POA: Diagnosis not present

## 2017-06-13 DIAGNOSIS — Z5181 Encounter for therapeutic drug level monitoring: Secondary | ICD-10-CM | POA: Diagnosis not present

## 2017-06-13 DIAGNOSIS — K50919 Crohn's disease, unspecified, with unspecified complications: Secondary | ICD-10-CM | POA: Diagnosis not present

## 2017-10-21 ENCOUNTER — Ambulatory Visit: Payer: BLUE CROSS/BLUE SHIELD | Admitting: Urgent Care

## 2017-10-21 ENCOUNTER — Other Ambulatory Visit: Payer: Self-pay

## 2017-10-21 ENCOUNTER — Encounter: Payer: Self-pay | Admitting: Urgent Care

## 2017-10-21 VITALS — BP 102/62 | HR 96 | Temp 98.5°F | Resp 18 | Ht 73.23 in | Wt 146.8 lb

## 2017-10-21 DIAGNOSIS — R05 Cough: Secondary | ICD-10-CM

## 2017-10-21 DIAGNOSIS — R52 Pain, unspecified: Secondary | ICD-10-CM | POA: Diagnosis not present

## 2017-10-21 DIAGNOSIS — K50919 Crohn's disease, unspecified, with unspecified complications: Secondary | ICD-10-CM

## 2017-10-21 DIAGNOSIS — R69 Illness, unspecified: Secondary | ICD-10-CM | POA: Diagnosis not present

## 2017-10-21 DIAGNOSIS — R059 Cough, unspecified: Secondary | ICD-10-CM

## 2017-10-21 DIAGNOSIS — R070 Pain in throat: Secondary | ICD-10-CM | POA: Diagnosis not present

## 2017-10-21 DIAGNOSIS — J111 Influenza due to unidentified influenza virus with other respiratory manifestations: Secondary | ICD-10-CM

## 2017-10-21 LAB — POCT INFLUENZA A/B
INFLUENZA A, POC: NEGATIVE
Influenza B, POC: NEGATIVE

## 2017-10-21 MED ORDER — OSELTAMIVIR PHOSPHATE 75 MG PO CAPS: 75.0000 mg | ORAL_CAPSULE | Freq: Two times a day (BID) | ORAL | 0 refills | Status: DC

## 2017-10-21 MED ORDER — BENZONATATE 100 MG PO CAPS: 100.0000 mg | ORAL_CAPSULE | Freq: Three times a day (TID) | ORAL | 0 refills | Status: DC | PRN

## 2017-10-21 NOTE — Progress Notes (Signed)
  MRN: [id number] DOB: [birth year]  Subjective:   Richard Beasley is a 22 y.o. male presenting for 1 day history of body aches, sore throat, subjective fever, chills, dry cough, headache, lymph node pain. Has tried APAP with some relief.  Review of Systems  HENT: Negative for ear pain and sinus pain.   Respiratory: Negative for hemoptysis, sputum production, shortness of breath and wheezing.   Cardiovascular: Negative for chest pain.  Gastrointestinal: Negative for abdominal pain, nausea and vomiting.  Skin: Negative for rash.    Richard Beasley has a current medication list which includes the following prescription(s): azathioprine, colestipol, cyclobenzaprine, and tramadol. Also is allergic to ceclor [cefaclor].  Richard Beasley  has a past medical history of Fatigue (01/19/2011), Generalized headaches (01/19/2011), IBS (irritable bowel syndrome) (01/19/2011), Nausea & vomiting (01/19/2011), and Pilonidal cyst (01/19/2011). Also  has a past surgical history that includes Hydrocele excision / repair (2009); Elbow fracture surgery (2000); and Fracture surgery (2000).  Objective:   Vitals: BP 102/62 (BP Location: Left Arm, Patient Position: Sitting, Cuff Size: Normal)   Pulse 96   Temp 98.5 F (36.9 C) (Oral)   Resp 18   Ht 6' 1.23" (1.86 m)   Wt 146 lb 12.8 oz (66.6 kg)   SpO2 98%   BMI 19.25 kg/m   Physical Exam  Constitutional: He is oriented to person, place, and time. He appears well-developed and well-nourished.  HENT:  Right Ear: Tympanic membrane normal.  Left Ear: Tympanic membrane normal.  Mouth/Throat: No oropharyngeal exudate.  Eyes: Right eye exhibits no discharge. Left eye exhibits no discharge.  Neck: Normal range of motion. Neck supple.  Bilateral lymph node tenderness.  Cardiovascular: Normal rate, regular rhythm and intact distal pulses. Exam reveals no gallop and no friction rub.  No murmur heard. Pulmonary/Chest: No respiratory distress. He has no wheezes. He has no rales.    Lymphadenopathy:    He has no cervical adenopathy.  Neurological: He is alert and oriented to person, place, and time.  Skin: Skin is warm and dry.  Psychiatric: He has a normal mood and affect.   Results for orders placed or performed in visit on 10/21/17 (from the past 24 hour(s))  POCT Influenza A/B     Status: None   Collection Time: 10/21/17  8:25 AM  Result Value Ref Range   Influenza A, POC Negative Negative   Influenza B, POC Negative Negative   Assessment and Plan :   Influenza-like illness - Plan: Care order/instruction:  Generalized body aches - Plan: POCT Influenza A/B  Throat pain  Cough  Crohn's disease with complication, unspecified gastrointestinal tract location [facility])  Will manage for influenza. Start supportive care. Return-to-clinic precautions discussed, patient verbalized understanding.   Jaynee Eagles, PA-C [facility] [phone] 10/21/2017  8:18 AM

## 2017-10-21 NOTE — Patient Instructions (Addendum)
Hydrate well with at least 2 liters (1 gallon) of water daily. For sore throat try using a honey-based tea. Use 3 teaspoons of honey with juice squeezed from half lemon. Place shaved pieces of ginger into 1/2-1 cup of water and warm over stove top. Then mix the ingredients and repeat every 4 hours as needed. You can also try pseudoephedrine 149m twice daily for post-nasal drainage. You may take 5066mTylenol every 6 hours for body aches, fever, pain and inflammation.    Influenza, Adult Influenza, more commonly known as "the flu," is a viral infection that primarily affects the respiratory tract. The respiratory tract includes organs that help you breathe, such as the lungs, nose, and throat. The flu causes many common cold symptoms, as well as a high fever and body aches. The flu spreads easily from person to person (is contagious). Getting a flu shot (influenza vaccination) every year is the best way to prevent influenza. What are the causes? Influenza is caused by a virus. You can catch the virus by:  Breathing in droplets from an infected person's cough or sneeze.  Touching something that was recently contaminated with the virus and then touching your mouth, nose, or eyes.  What increases the risk? The following factors may make you more likely to get the flu:  Not cleaning your hands frequently with soap and water or alcohol-based hand sanitizer.  Having close contact with many people during cold and flu season.  Touching your mouth, eyes, or nose without washing or sanitizing your hands first.  Not drinking enough fluids or not eating a healthy diet.  Not getting enough sleep or exercise.  Being under a high amount of stress.  Not getting a yearly (annual) flu shot.  You may be at a higher risk of complications from the flu, such as a severe lung infection (pneumonia), if you:  Are over the age of 24 Are pregnant.  Have a weakened disease-fighting system (immune system).  You may have a weakened immune system if you: ? Have HIV or AIDS. ? Are undergoing chemotherapy. ? Aretaking medicines that reduce the activity of (suppress) the immune system.  Have a long-term (chronic) illness, such as heart disease, kidney disease, diabetes, or lung disease.  Have a liver disorder.  Are obese.  Have anemia.  What are the signs or symptoms? Symptoms of this condition typically last 4-10 days and may include:  Fever.  Chills.  Headache, body aches, or muscle aches.  Sore throat.  Cough.  Runny or congested nose.  Chest discomfort and cough.  Poor appetite.  Weakness or tiredness (fatigue).  Dizziness.  Nausea or vomiting.  How is this diagnosed? This condition may be diagnosed based on your medical history and a physical exam. Your health care provider may do a nose or throat swab test to confirm the diagnosis. How is this treated? If influenza is detected early, you can be treated with antiviral medicine that can reduce the length of your illness and the severity of your symptoms. This medicine may be given by mouth (orally) or through an IV tube that is inserted in one of your veins. The goal of treatment is to relieve symptoms by taking care of yourself at home. This may include taking over-the-counter medicines, drinking plenty of fluids, and adding humidity to the air in your home. In some cases, influenza goes away on its own. Severe influenza or complications from influenza may be treated in a hospital. Follow these instructions at home:  Take over-the-counter and prescription medicines only as told by your health care provider.  Use a cool mist humidifier to add humidity to the air in your home. This can make breathing easier.  Rest as needed.  Drink enough fluid to keep your urine clear or pale yellow.  Cover your mouth and nose when you cough or sneeze.  Wash your hands with soap and water often, especially after you cough or  sneeze. If soap and water are not available, use hand sanitizer.  Stay home from work or school as told by your health care provider. Unless you are visiting your health care provider, try to avoid leaving home until your fever has been gone for 24 hours without the use of medicine.  Keep all follow-up visits as told by your health care provider. This is important. How is this prevented?  Getting an annual flu shot is the best way to avoid getting the flu. You may get the flu shot in late summer, fall, or winter. Ask your health care provider when you should get your flu shot.  Wash your hands often or use hand sanitizer often.  Avoid contact with people who are sick during cold and flu season.  Eat a healthy diet, drink plenty of fluids, get enough sleep, and exercise regularly. Contact a health care provider if:  You develop new symptoms.  You have: ? Chest pain. ? Diarrhea. ? A fever.  Your cough gets worse.  You produce more mucus.  You feel nauseous or you vomit. Get help right away if:  You develop shortness of breath or difficulty breathing.  Your skin or nails turn a bluish color.  You have severe pain or stiffness in your neck.  You develop a sudden headache or sudden pain in your face or ear.  You cannot stop vomiting. This information is not intended to replace advice given to you by your health care provider. Make sure you discuss any questions you have with your health care provider. Document Released: 08/12/2000 Document Revised: 01/21/2016 Document Reviewed: 06/09/2015 Elsevier Interactive Patient Education  2017 [employer].     IF you received an x-ray today, you will receive an invoice from [facility]. Please contact [facility] at [phone] with questions or concerns regarding your invoice.   IF you received labwork today, you will receive an invoice from [place]. Please contact [facility] at [phone] with questions or  concerns regarding your invoice.   Our billing staff will not be able to assist you with questions regarding bills from these companies.  You will be contacted with the lab results as soon as they are available. The fastest way to get your results is to activate your My Chart account. Instructions are located on the last page of this paperwork. If you have not heard from [place] regarding the results in 2 weeks, please contact this office.

## 2017-10-27 ENCOUNTER — Ambulatory Visit: Payer: BLUE CROSS/BLUE SHIELD | Admitting: Urgent Care

## 2017-10-28 ENCOUNTER — Ambulatory Visit: Payer: BLUE CROSS/BLUE SHIELD | Admitting: Family Medicine

## 2017-10-28 ENCOUNTER — Other Ambulatory Visit: Payer: Self-pay

## 2017-10-28 ENCOUNTER — Encounter: Payer: Self-pay | Admitting: Family Medicine

## 2017-10-28 VITALS — BP 100/70 | HR 83 | Temp 98.1°F | Resp 16 | Ht 72.5 in | Wt 141.8 lb

## 2017-10-28 DIAGNOSIS — R0981 Nasal congestion: Secondary | ICD-10-CM | POA: Diagnosis not present

## 2017-10-28 MED ORDER — PSEUDOEPHEDRINE-GUAIFENESIN ER 120-1200 MG PO TB12: 1.0000 | ORAL_TABLET | Freq: Two times a day (BID) | ORAL | 0 refills | Status: DC

## 2017-10-28 NOTE — Patient Instructions (Addendum)
Try mucinex D twice a day for the next several days.  I recommend frequent warm salt water gargles, hot tea with honey and lemon, rest, and handwashing.  Hot showers or breathing in steam may help loosen the congestion. Nasal saline spray throughout the day every few hours and/or a cool mist humidifier at night can help prevent sinus congestion. Try a netti pot or sinus rinse is also likely to help you feel better and keep this from progressing.    IF you received an x-ray today, you will receive an invoice from [facility]. Please contact [facility] at [phone] with questions or concerns regarding your invoice.   IF you received labwork today, you will receive an invoice from [place]. Please contact [facility] at [phone] with questions or concerns regarding your invoice.   Our billing staff will not be able to assist you with questions regarding bills from these companies.  You will be contacted with the lab results as soon as they are available. The fastest way to get your results is to activate your My Chart account. Instructions are located on the last page of this paperwork. If you have not heard from [place] regarding the results in 2 weeks, please contact this office.     Upper Respiratory Infection, Adult Most upper respiratory infections (URIs) are a viral infection of the air passages leading to the lungs. A URI affects the nose, throat, and upper air passages. The most common type of URI is nasopharyngitis and is typically referred to as "the common cold." URIs run their course and usually go away on their own. Most of the time, a URI does not require medical attention, but sometimes a bacterial infection in the upper airways can follow a viral infection. This is called a secondary infection. Sinus and middle ear infections are common types of secondary upper respiratory infections. Bacterial pneumonia can also complicate a URI. A URI can worsen asthma and chronic  obstructive pulmonary disease (COPD). Sometimes, these complications can require emergency medical care and may be life threatening. What are the causes? Almost all URIs are caused by viruses. A virus is a type of germ and can spread from one person to another. What increases the risk? You may be at risk for a URI if:  You smoke.  You have chronic heart or lung disease.  You have a weakened defense (immune) system.  You are very young or very old.  You have nasal allergies or asthma.  You work in crowded or poorly ventilated areas.  You work in health care facilities or schools.  What are the signs or symptoms? Symptoms typically develop 2-3 days after you come in contact with a cold virus. Most viral URIs last 7-10 days. However, viral URIs from the influenza virus (flu virus) can last 14-18 days and are typically more severe. Symptoms may include:  Runny or stuffy (congested) nose.  Sneezing.  Cough.  Sore throat.  Headache.  Fatigue.  Fever.  Loss of appetite.  Pain in your forehead, behind your eyes, and over your cheekbones (sinus pain).  Muscle aches.  How is this diagnosed? Your health care provider may diagnose a URI by:  Physical exam.  Tests to check that your symptoms are not due to another condition such as: ? Strep throat. ? Sinusitis. ? Pneumonia. ? Asthma.  How is this treated? A URI goes away on its own with time. It cannot be cured with medicines, but medicines may be prescribed or recommended to relieve  symptoms. Medicines may help:  Reduce your fever.  Reduce your cough.  Relieve nasal congestion.  Follow these instructions at home:  Take medicines only as directed by your health care provider.  Gargle warm saltwater or take cough drops to comfort your throat as directed by your health care provider.  Use a warm mist humidifier or inhale steam from a shower to increase air moisture. This may make it easier to breathe.  Drink  enough fluid to keep your urine clear or pale yellow.  Eat soups and other clear broths and maintain good nutrition.  Rest as needed.  Return to work when your temperature has returned to normal or as your health care provider advises. You may need to stay home longer to avoid infecting others. You can also use a face mask and careful hand washing to prevent spread of the virus.  Increase the usage of your inhaler if you have asthma.  Do not use any tobacco products, including cigarettes, chewing tobacco, or electronic cigarettes. If you need help quitting, ask your health care provider. How is this prevented? The best way to protect yourself from getting a cold is to practice good hygiene.  Avoid oral or hand contact with people with cold symptoms.  Wash your hands often if contact occurs.  There is no clear evidence that vitamin C, vitamin E, echinacea, or exercise reduces the chance of developing a cold. However, it is always recommended to get plenty of rest, exercise, and practice good nutrition. Contact a health care provider if:  You are getting worse rather than better.  Your symptoms are not controlled by medicine.  You have chills.  You have worsening shortness of breath.  You have brown or red mucus.  You have yellow or brown nasal discharge.  You have pain in your face, especially when you bend forward.  You have a fever.  You have swollen neck glands.  You have pain while swallowing.  You have white areas in the back of your throat. Get help right away if:  You have severe or persistent: ? Headache. ? Ear pain. ? Sinus pain. ? Chest pain.  You have chronic lung disease and any of the following: ? Wheezing. ? Prolonged cough. ? Coughing up blood. ? A change in your usual mucus.  You have a stiff neck.  You have changes in your: ? Vision. ? Hearing. ? Thinking. ? Mood. This information is not intended to replace advice given to you by your  health care provider. Make sure you discuss any questions you have with your health care provider. Document Released: 02/08/2001 Document Revised: 04/17/2016 Document Reviewed: 11/20/2013 Elsevier Interactive Patient Education  [employer].

## 2017-10-28 NOTE — Progress Notes (Addendum)
Subjective:  By signing my name below, I, Richard Beasley, attest that this documentation has been prepared under the direction and in the presence of Delman Cheadle, MD. Electronically Signed: Moises Beasley, [place]. 10/28/2017 , 12:43 PM .  Patient was seen in Room 3 .   Patient ID: Richard Beasley, male    DOB: 12-04-1995, 22 y.o.   MRN: [id number] Chief Complaint  Patient presents with  . Follow-up    from 10/21/2017, from flu-like symptoms  . Cough    "sometimes white mucus"  . sinus congestion    "still blowing nose" green mucus with little bit of Beasley in it"   HPI Richard Beasley is a 22 y.o. male who presents to Primary Care at [facility] for follow up. Patient was seen 1 week ago by Jaynee Eagles, PA-C, and had negative flu test, but symptoms were very consistent, and had started 1 day prior. He was treated with Tamiflu and PRN tessalon as well as home remedies.   Patient states he's doing much better this week; denies any fever or chills over the past few days. He denies any facial pain or pressure. He's still blowing out green mucus, as well as coughing similar mucus out. He's noticed some Beasley in the mucus. He's been taking OTC cough medicine. He notes his sore throat has improved. He denies usually having seasonal allergies or sinus problems. He denies using nasal sprays.   Past Medical History:  Diagnosis Date  . Fatigue 01/19/2011  . Generalized headaches 01/19/2011  . IBS (irritable bowel syndrome) 01/19/2011  . Nausea & vomiting 01/19/2011  . Pilonidal cyst 01/19/2011   Past Surgical History:  Procedure Laterality Date  . ELBOW FRACTURE SURGERY  2000  . FRACTURE SURGERY  2000   Elbow  . HYDROCELE EXCISION / REPAIR  2009   Prior to Admission medications   Medication Sig Start Date End Date Taking? Authorizing Provider  azaTHIOprine (IMURAN) 50 MG tablet Take 100 mg by mouth daily.  07/04/14   [provider]  benzonatate (TESSALON) 100 MG capsule Take 1-2 capsules (100-200 mg  total) by mouth 3 (three) times daily as needed. 10/21/17   Jaynee Eagles, PA-C  oseltamivir (TAMIFLU) 75 MG capsule Take 1 capsule (75 mg total) by mouth 2 (two) times daily. 10/21/17   Jaynee Eagles, PA-C   Allergies  Allergen Reactions  . Ceclor [Cefaclor]    No family history on file. Social History   Socioeconomic History  . Marital status: Single    Spouse name: None  . Number of children: 0  . Years of education: None  . Highest education level: None  Social Needs  . Financial resource strain: None  . Food insecurity - worry: None  . Food insecurity - inability: None  . Transportation needs - medical: None  . Transportation needs - non-medical: None  Occupational History  . None  Tobacco Use  . Smoking status: Never Smoker  . Smokeless tobacco: Never Used  Substance and Sexual Activity  . Alcohol use: No  . Drug use: No  . Sexual activity: Not Currently  Other Topics Concern  . None  Social History Narrative  . None   Depression screen [facility] 2/9 10/21/2017 03/15/2016 09/23/2015 07/02/2015 06/16/2015  Decreased Interest 0 0 0 0 0  Down, Depressed, Hopeless 0 0 0 0 0  PHQ - 2 Score 0 0 0 0 0    Review of Systems  Constitutional: Negative for chills, fatigue, fever and unexpected weight change.  HENT: Positive for congestion and sore throat (improved). Negative for sinus pressure and sinus pain.   Eyes: Negative for visual disturbance.  Respiratory: Positive for cough. Negative for chest tightness and shortness of breath.   Cardiovascular: Negative for chest pain, palpitations and leg swelling.  Gastrointestinal: Negative for abdominal pain and Beasley in stool.  Allergic/Immunologic: Negative for environmental allergies.  Neurological: Negative for dizziness, light-headedness and headaches.       Objective:   Physical Exam  Constitutional: He is oriented to person, place, and time. He appears well-developed and well-nourished. No distress.  HENT:  Head: Normocephalic  and atraumatic.  Right Ear: A middle ear effusion (slight) is present.  Left Ear: Tympanic membrane normal.  Nose: Nose normal.  Mouth/Throat: Posterior oropharyngeal erythema present.  Eyes: EOM are normal. Pupils are equal, round, and reactive to light.  Neck: Neck supple.  Cardiovascular: Normal rate, regular rhythm and normal heart sounds.  Pulmonary/Chest: Effort normal and breath sounds normal. No respiratory distress. He has no wheezes.  Musculoskeletal: Normal range of motion.  Lymphadenopathy:    He has cervical adenopathy (mild left anterior).  Neurological: He is alert and oriented to person, place, and time.  Skin: Skin is warm and dry.  Psychiatric: He has a normal mood and affect. His behavior is normal.  Nursing note and vitals reviewed.   BP 100/70   Pulse 83   Temp 98.1 F (36.7 C) (Oral)   Resp 16   Ht 6' 0.5" (1.842 m)   Wt 141 lb 12.8 oz (64.3 kg)   SpO2 98%   BMI 18.97 kg/m      Assessment & Plan:   1. Sinus congestion   Sig improvement. Cont rest and prn symptomatic care.  Meds ordered this encounter  Medications  . Pseudoephedrine-Guaifenesin (MUCINEX D MAX STRENGTH) [id number] MG TB12    Sig: Take 1 tablet by mouth 2 (two) times daily.    Dispense:  12 each    Refill:  0    I personally performed the services described in this documentation, which was scribed in my presence. The recorded information has been reviewed and considered, and addended by me as needed.   Delman Cheadle, M.D.  Primary Care at [facility] [address] [place], [place] [address] [phone] phone [phone] fax  10/29/17 10:09 AM

## 2017-11-01 ENCOUNTER — Ambulatory Visit: Payer: BLUE CROSS/BLUE SHIELD | Admitting: Family Medicine

## 2017-11-16 ENCOUNTER — Other Ambulatory Visit: Payer: Self-pay

## 2017-11-16 ENCOUNTER — Ambulatory Visit (INDEPENDENT_AMBULATORY_CARE_PROVIDER_SITE_OTHER): Payer: BLUE CROSS/BLUE SHIELD

## 2017-11-16 ENCOUNTER — Ambulatory Visit: Payer: BLUE CROSS/BLUE SHIELD | Admitting: Physician Assistant

## 2017-11-16 ENCOUNTER — Encounter: Payer: Self-pay | Admitting: Physician Assistant

## 2017-11-16 VITALS — BP 126/70 | HR 94 | Temp 97.2°F | Resp 16 | Ht 72.5 in | Wt 146.0 lb

## 2017-11-16 DIAGNOSIS — R002 Palpitations: Secondary | ICD-10-CM | POA: Diagnosis not present

## 2017-11-16 LAB — POCT CBC
GRANULOCYTE PERCENT: 75.5 % (ref 37–80)
HEMATOCRIT: 44.9 % (ref 43.5–53.7)
Hemoglobin: 14.5 g/dL (ref 14.1–18.1)
Lymph, poc: 1.2 (ref 0.6–3.4)
MCH, POC: 29.9 pg (ref 27–31.2)
MCHC: 32.2 g/dL (ref 31.8–35.4)
MCV: 92.8 fL (ref 80–97)
MID (CBC): 0.3 (ref 0–0.9)
MPV: 8.8 fL (ref 0–99.8)
POC GRANULOCYTE: 3.7 (ref 2–6.9)
POC LYMPH %: 18.9 % (ref 10–50)
POC MID %: 5.6 %M (ref 0–12)
Platelet Count, POC: 240 10*3/uL (ref 142–424)
RBC: 4.84 M/uL (ref 4.69–6.13)
RDW, POC: 13.5 %
WBC: 6.2 10*3/uL (ref 4.6–10.2)

## 2017-11-16 LAB — GLUCOSE, POCT (MANUAL RESULT ENTRY): POC Glucose: 66 mg/dl — AB (ref 70–99)

## 2017-11-16 NOTE — Patient Instructions (Addendum)
So far, there is NO evidence of myocarditis. I will let you know when I get the results of the remaining tests.  The chest xray shows evidence of the recent respiratory infection, but no pneumonia, or evidence of any other cardiopulmonary problem. The cough may last 4-6 weeks. It's hard to be patient, but I recommend waiting to resume your regular exercise until you are fully well.  In the meantime, please avoid caffeine and decongestant containing medications (like pseudofed). You CAN use guaifenesin and antihistamines. Drink plenty of water (64 ounces/day). Eat regularly.    IF you received an x-ray today, you will receive an invoice from [facility]. Please contact [facility] at [phone] with questions or concerns regarding your invoice.   IF you received labwork today, you will receive an invoice from [place]. Please contact [facility] at [phone] with questions or concerns regarding your invoice.   Our billing staff will not be able to assist you with questions regarding bills from these companies.  You will be contacted with the lab results as soon as they are available. The fastest way to get your results is to activate your My Chart account. Instructions are located on the last page of this paperwork. If you have not heard from [place] regarding the results in 2 weeks, please contact this office.

## 2017-11-16 NOTE — Progress Notes (Signed)
Patient ID: Richard Beasley, male    DOB: 01-18-1996, 22 y.o.   MRN: [id number]  PCP: Wendie Agreste, MD  Chief Complaint  Patient presents with  . Irregular Heart Beat    about week off and on while you active     Subjective:   Presents for evaluation of irregular heartbeat.  THis patient is accompanied by his mother.  He has Crohn's disease and is scheduled for colonoscopy Monday, 11/20/17, with Dr. Chana Bode. Recent influenza illness, resolved.  Beginning 1 week ago, he has experienced episodes of rapid and irregular heartbeat. Each episode is associated with consumption of caffeine-containing beverage and/or exercise at the gym (at his usual intensity), and lasts about 60 minutes. He describes the sensation as though his heart may beat out of his chest, or that he's consumed lots of energy drinks.  His mother is concerned that he may have myocarditis. He has a strong family history of heart disease. His father's first MI was at age 61, and there are multiple paternal family members with heart diease, a maternal uncle had an MI in his 57's.  No associated SOB, HA, dizziness, neck, jaw or arm pain. Symptoms did not cause him to need to stop exercising, or to reduce his exercise intensity. No nausea, vomiting. No rash.  No illicit drug use. He has been using OTC cough/cold products to treat recent influenza.   Review of Systems As above.    Patient Active Problem List   Diagnosis Date Noted  . Hydrocele, left 01/23/2015  . Crohn's disease ([place]) 01/23/2015  . Intestinal obstruction ([place]) 06/24/2011     Prior to Admission medications   Medication Sig Start Date End Date Taking? Authorizing Provider  azaTHIOprine (IMURAN) 50 MG tablet Take 100 mg by mouth daily.  07/04/14  Yes [provider]  benzonatate (TESSALON) 100 MG capsule Take 1-2 capsules (100-200 mg total) by mouth 3 (three) times daily as needed. 10/21/17  Yes Jaynee Eagles, PA-C    Pseudoephedrine-Guaifenesin (MUCINEX D MAX STRENGTH) [phone] MG TB12 Take 1 tablet by mouth 2 (two) times daily. Patient not taking: Reported on 11/16/2017 10/28/17   Shawnee Knapp, MD     Allergies  Allergen Reactions  . Ceclor [Cefaclor]        Objective:  Physical Exam  Constitutional: He is oriented to person, place, and time. He appears well-developed and well-nourished. He is active and cooperative. No distress.  BP 126/70   Pulse 94   Temp (!) 97.2 F (36.2 C)   Resp 16   Ht 6' 0.5" (1.842 m)   Wt 146 lb (66.2 kg)   SpO2 98%   BMI 19.53 kg/m   HENT:  Head: Normocephalic and atraumatic.  Right Ear: Hearing normal.  Left Ear: Hearing normal.  Eyes: Conjunctivae are normal. No scleral icterus.  Neck: Normal range of motion. Neck supple. No thyromegaly present.  Cardiovascular: Normal rate, regular rhythm and normal heart sounds.  Pulses:      Radial pulses are 2+ on the right side, and 2+ on the left side.  Pulmonary/Chest: Effort normal and breath sounds normal.  Lymphadenopathy:       Head (right side): No tonsillar, no preauricular, no posterior auricular and no occipital adenopathy present.       Head (left side): No tonsillar, no preauricular, no posterior auricular and no occipital adenopathy present.    He has no cervical adenopathy.       Right: No supraclavicular adenopathy present.  Left: No supraclavicular adenopathy present.  Neurological: He is alert and oriented to person, place, and time. No sensory deficit.  Skin: Skin is warm, dry and intact. No rash noted. No cyanosis or erythema. Nails show no clubbing.  Psychiatric: He has a normal mood and affect. His speech is normal and behavior is normal.   EKG reviewed with Dr. Pamella Pert. NSR. Rate 66 BPM. PR 132. QT 382.  Results for orders placed or performed in visit on 11/16/17  POCT CBC  Result Value Ref Range   WBC 6.2 4.6 - 10.2 K/uL   Lymph, poc 1.2 0.6 - 3.4   POC LYMPH PERCENT 18.9 10 - 50 %L    MID (cbc) 0.3 0 - 0.9   POC MID % 5.6 0 - 12 %M   POC Granulocyte 3.7 2 - 6.9   Granulocyte percent 75.5 37 - 80 %G   RBC 4.84 4.69 - 6.13 M/uL   Hemoglobin 14.5 14.1 - 18.1 g/dL   HCT, POC 44.9 43.5 - 53.7 %   MCV 92.8 80 - 97 fL   MCH, POC 29.9 27 - 31.2 pg   MCHC 32.2 31.8 - 35.4 g/dL   RDW, POC 13.5 %   Platelet Count, POC 240 142 - 424 K/uL   MPV 8.8 0 - 99.8 fL  POCT glucose (manual entry)  Result Value Ref Range   POC Glucose 66 (A) 70 - 99 mg/dl    Dg Chest 2 View  Result Date: 11/16/2017 CLINICAL DATA:  Palpitations, concern for myocarditis EXAM: CHEST - 2 VIEW COMPARISON:  None FINDINGS: Normal heart size, mediastinal contours, and pulmonary vascularity. Peribronchial thickening and hyperinflation question bronchitis versus asthma. No acute infiltrate, pleural effusion or pneumothorax. Bones unremarkable. IMPRESSION: Peribronchial thickening and mild hyperinflation which could reflect bronchitis or asthma. No acute infiltrate. Electronically Signed   By: Lavonia Dana M.D.   On: 11/16/2017 15:51       Assessment & Plan:   1. Heart palpitations Reassuring CBC, glucose, CXR and EKG. Suspect that his symptoms are due to stimulants (caffeine and pseudoephedrine) following influenza illness. Recommend more rest, to recover from illness, and discontinuation of stimulants. Await ESR and troponin, both anticipated to be normal, but which could suggest underlying myocarditis if abnormal. - EKG 12-Lead - DG Chest 2 View; Future - POCT CBC - POCT urinalysis dipstick (cancelled, as pt unable to void) - POCT glucose (manual entry) - Comprehensive metabolic panel - TSH - Troponin I - Sedimentation rate    Return if symptoms worsen or fail to improve.   Fara Chute, PA-C Primary Care at [place]

## 2017-11-17 LAB — COMPREHENSIVE METABOLIC PANEL
A/G RATIO: 2 (ref 1.2–2.2)
ALBUMIN: 4.7 g/dL (ref 3.5–5.5)
ALK PHOS: 62 IU/L (ref 39–117)
ALT: 15 IU/L (ref 0–44)
AST: 17 IU/L (ref 0–40)
BUN / CREAT RATIO: 12 (ref 9–20)
BUN: 11 mg/dL (ref 6–20)
Bilirubin Total: 0.5 mg/dL (ref 0.0–1.2)
CO2: 25 mmol/L (ref 20–29)
CREATININE: 0.92 mg/dL (ref 0.76–1.27)
Calcium: 9.6 mg/dL (ref 8.7–10.2)
Chloride: 104 mmol/L (ref 96–106)
GFR calc Af Amer: 134 mL/min/{1.73_m2} (ref >59)
GFR, EST NON AFRICAN AMERICAN: 116 mL/min/{1.73_m2} (ref >59)
GLOBULIN, TOTAL: 2.4 g/dL (ref 1.5–4.5)
Glucose: 74 mg/dL (ref 65–99)
POTASSIUM: 5.1 mmol/L (ref 3.5–5.2)
SODIUM: 143 mmol/L (ref 134–144)
Total Protein: 7.1 g/dL (ref 6.0–8.5)

## 2017-11-17 LAB — SEDIMENTATION RATE: SED RATE: 2 mm/h (ref 0–15)

## 2017-11-17 LAB — TROPONIN I: Troponin I: <0.01 ng/mL (ref 0.00–0.04)

## 2017-11-17 LAB — TSH: TSH: 2.25 u[IU]/mL (ref 0.450–4.500)

## 2017-11-20 DIAGNOSIS — K5000 Crohn's disease of small intestine without complications: Secondary | ICD-10-CM | POA: Diagnosis not present

## 2017-11-20 DIAGNOSIS — Z980 Intestinal bypass and anastomosis status: Secondary | ICD-10-CM | POA: Diagnosis not present

## 2017-11-20 DIAGNOSIS — Z09 Encounter for follow-up examination after completed treatment for conditions other than malignant neoplasm: Secondary | ICD-10-CM | POA: Diagnosis not present

## 2018-04-05 DIAGNOSIS — Z5181 Encounter for therapeutic drug level monitoring: Secondary | ICD-10-CM | POA: Diagnosis not present

## 2018-04-05 DIAGNOSIS — K50919 Crohn's disease, unspecified, with unspecified complications: Secondary | ICD-10-CM | POA: Diagnosis not present

## 2019-05-17 DIAGNOSIS — M545 Low back pain: Secondary | ICD-10-CM | POA: Diagnosis not present

## 2020-02-19 ENCOUNTER — Ambulatory Visit (INDEPENDENT_AMBULATORY_CARE_PROVIDER_SITE_OTHER): Payer: BC Managed Care – PPO | Admitting: Family Medicine

## 2020-02-19 ENCOUNTER — Ambulatory Visit (INDEPENDENT_AMBULATORY_CARE_PROVIDER_SITE_OTHER): Payer: BC Managed Care – PPO

## 2020-02-19 ENCOUNTER — Encounter: Payer: Self-pay | Admitting: Family Medicine

## 2020-02-19 ENCOUNTER — Other Ambulatory Visit: Payer: Self-pay

## 2020-02-19 VITALS — BP 136/88 | HR 78 | Temp 98.5°F | Ht 72.0 in | Wt 156.4 lb

## 2020-02-19 DIAGNOSIS — M5440 Lumbago with sciatica, unspecified side: Secondary | ICD-10-CM | POA: Diagnosis not present

## 2020-02-19 DIAGNOSIS — M545 Low back pain: Secondary | ICD-10-CM | POA: Diagnosis not present

## 2020-02-19 MED ORDER — MELOXICAM 7.5 MG PO TABS: 7.5000 mg | ORAL_TABLET | Freq: Every day | ORAL | 0 refills | Status: DC

## 2020-02-19 MED ORDER — CYCLOBENZAPRINE HCL 5 MG PO TABS: 5.0000 mg | ORAL_TABLET | Freq: Three times a day (TID) | ORAL | 1 refills | Status: DC | PRN

## 2020-02-19 NOTE — Progress Notes (Signed)
Subjective:  Patient ID: Richard Beasley, male    DOB: 1996-08-26  Age: 24 y.o. MRN: [id number]  CC:  Chief Complaint  Patient presents with  . Back Pain    fell off picnic table while trying to jump on top of it.   . left hit    HPI Richard Beasley presents for   Low back pain, left hip pain:  Workout prior, then trying to jump up onto picnic table 4 days ago from ground level. Noted pain in low back as seen as he jumped.  Able to stand afterward, but had to take breaks and lie on ground, but walking for short periods.   No bowel or bladder incontinence, no saddle anesthesia, no lower extremity weakness, just sharp pain.  No radiation. More sore after rest, better when up and moving - worse after driving.  Pain and tingling down both thighs since injury.  No pain meds.   Job requires bending/squatting /lifting and stocking product - [employer].   Treated for bilateral low back pain with sciatica 2017.  Previous lumbar spine x-ray was normal in August 2016.  Was having some radiating pain at that time to testicles.  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  Referred to physical therapy.  At that time had been referred to back specialist, Dr. Lynann Bologna, but several messages were left with no response. Had laser treatment to back and stretches - had been doing better.  Back had been overall better, only occasional soreness that improved with stretching.    History Patient Active Problem List   Diagnosis Date Noted  . Hydrocele, left 01/23/2015  . Crohn's disease ([place]) 01/23/2015  . Intestinal obstruction ([employer]) 06/24/2011   Past Medical History:  Diagnosis Date  . Fatigue 01/19/2011  . Generalized headaches 01/19/2011  . IBS (irritable bowel syndrome) 01/19/2011  . Nausea & vomiting 01/19/2011  . Pilonidal cyst 01/19/2011   Past Surgical History:  Procedure Laterality Date  . ELBOW FRACTURE SURGERY  2000  . FRACTURE  SURGERY  2000   Elbow  . HYDROCELE EXCISION / REPAIR  2009   Allergies  Allergen Reactions  . Ceclor [Cefaclor]    Prior to Admission medications   Not on File   Social History   Socioeconomic History  . Marital status: Single    Spouse name: Not on file  . Number of children: 0  . Years of education: Not on file  . Highest education level: Not on file  Occupational History    Employer: [employer]  Tobacco Use  . Smoking status: Never Smoker  . Smokeless tobacco: Never Used  Vaping Use  . Vaping Use: Never used  Substance and Sexual Activity  . Alcohol use: No  . Drug use: No  . Sexual activity: Not Currently  Other Topics Concern  . Not on file  Social History Narrative  . Not on file   Social Determinants of Health   Financial Resource Strain:   . Difficulty of Paying Living Expenses:   Food Insecurity:   . Worried About Charity fundraiser in the Last Year:   . Arboriculturist in the Last Year:   Transportation Needs:   . Film/video editor (Medical):   Marland Kitchen Lack of Transportation (Non-Medical):   Physical Activity:   . Days of Exercise per Week:   . Minutes of Exercise per Session:   Stress:   . Feeling of Stress :  Social Connections:   . Frequency of Communication with Friends and Family:   . Frequency of Social Gatherings with Friends and Family:   . Attends Religious Services:   . Active Member of Clubs or Organizations:   . Attends Archivist Meetings:   Marland Kitchen Marital Status:   Intimate Partner Violence:   . Fear of Current or Ex-Partner:   . Emotionally Abused:   Marland Kitchen Physically Abused:   . Sexually Abused:     Review of Systems Per hpi.   Objective:   Vitals:   02/19/20 1054  BP: 136/88  Pulse: 78  Temp: 98.5 F (36.9 C)  TempSrc: Temporal  SpO2: 98%  Weight: 156 lb 6.4 oz (70.9 kg)  Height: 6' (1.829 m)     Physical Exam Vitals reviewed.  Constitutional:      Appearance: He is well-developed.  HENT:      Head: Normocephalic and atraumatic.  Pulmonary:     Effort: Pulmonary effort is normal.  Abdominal:     Palpations: Abdomen is soft.     Tenderness: There is no abdominal tenderness.  Musculoskeletal:        General: Tenderness (left SI, min paraspinals, and ttp over sciatic notch on left. ) present.     Cervical back: Normal range of motion.     Lumbar back: Spasms and tenderness present. No bony tenderness. Decreased range of motion (decreased flexion. neg SLR. ).  Skin:    General: Skin is warm and dry.  Neurological:     Mental Status: He is alert.     Sensory: No sensory deficit.     Deep Tendon Reflexes:     Reflex Scores:      Patellar reflexes are 2+ on the right side and 2+ on the left side.      Achilles reflexes are 2+ on the right side and 2+ on the left side.    Comments: Able to heel and toe walk without difficulty.  Psychiatric:        Behavior: Behavior normal.    DG Lumbar Spine Complete  Result Date: 02/19/2020 CLINICAL DATA:  Low back pain after jumping 4 days ago. EXAM: LUMBAR SPINE - COMPLETE 4+ VIEW COMPARISON:  None. FINDINGS: There are 5 nonrib bearing lumbar-type vertebral bodies. The vertebral body heights are maintained. There is no acute fracture. There is no static listhesis. There is no spondylolysis. There is mild degenerative disease with disc height loss at L5-S1. The SI joints are unremarkable. IMPRESSION: No acute osseous injury of the lumbar spine. Electronically Signed   By: Kathreen Devoid   On: 02/19/2020 12:26    Assessment & Plan:  Richard Beasley is a 24 y.o. male . Bilateral low back pain with sciatica, sciatica laterality unspecified, unspecified chronicity - Plan: meloxicam (MOBIC) 7.5 MG tablet, cyclobenzaprine (FLEXERIL) 5 MG tablet, DG Lumbar Spine Complete  -Acute back pain as above with previous chronic back pain.  Had been managed fairly well prior to recent injury.  X-ray noted with disc height loss at L5-S1.  Potential HNP.  Initial  trial of meloxicam, Flexeril, but would have low threshold for advanced imaging with MRI if not improving, and follow-up with neurosurgery discussed.  RTC/ER precautions.  Other symptomatic care discussed with handout given.  No orders of the defined types were placed in this encounter.  Patient Instructions       If you have lab work done today you will be contacted with your lab results within the next  2 weeks.  If you have not heard from [place] then please contact us. The fastest way to get your results is to register for My Chart.   IF you received an x-ray today, you will receive an invoice from [facility]. Please contact [facility] at [phone] with questions or concerns regarding your invoice.   IF you received labwork today, you will receive an invoice from [place]. Please contact [facility] at [phone] with questions or concerns regarding your invoice.   Our billing staff will not be able to assist you with questions regarding bills from these companies.  You will be contacted with the lab results as soon as they are available. The fastest way to get your results is to activate your My Chart account. Instructions are located on the last page of this paperwork. If you have not heard from [place] regarding the results in 2 weeks, please contact this office.          Signed, Merri Ray, MD [facility]

## 2020-02-19 NOTE — Patient Instructions (Addendum)
Meloxicam once during the day as needed (avoid taking other NSAIDS like Alleve or Ibuprofen while taking this) and then flexeril if needed up to 3 times per day for spasm - start at bedtime (causes sedation - so should not drive or operate heavy machinery while taking this medicine.)  I will let you know if there are any concerns on your x-ray.  Follow-up in 1 week and we can decide about specialist referral at that time.  Let me know if Dr. Vertell Limber is in network.  Return to the clinic or go to the nearest emergency room if any of your symptoms worsen or new symptoms occur.  Acute Back Pain, Adult Acute back pain is sudden and usually short-lived. It is often caused by an injury to the muscles and tissues in the back. The injury may result from:  A muscle or ligament getting overstretched or torn (strained). Ligaments are tissues that connect bones to each other. Lifting something improperly can cause a back strain.  Wear and tear (degeneration) of the spinal disks. Spinal disks are circular tissue that provides cushioning between the bones of the spine (vertebrae).  Twisting motions, such as while playing sports or doing yard work.  A hit to the back.  Arthritis. You may have a physical exam, lab tests, and imaging tests to find the cause of your pain. Acute back pain usually goes away with rest and home care. Follow these instructions at home: Managing pain, stiffness, and swelling  Take over-the-counter and prescription medicines only as told by your health care provider.  Your health care provider may recommend applying ice during the first 24-48 hours after your pain starts. To do this: ? Put ice in a plastic bag. ? Place a towel between your skin and the bag. ? Leave the ice on for 20 minutes, 2-3 times a day.  If directed, apply heat to the affected area as often as told by your health care provider. Use the heat source that your health care provider recommends, such as a moist heat  pack or a heating pad. ? Place a towel between your skin and the heat source. ? Leave the heat on for 20-30 minutes. ? Remove the heat if your skin turns bright red. This is especially important if you are unable to feel pain, heat, or cold. You have a greater risk of getting burned. Activity   Do not stay in bed. Staying in bed for more than 1-2 days can delay your recovery.  Sit up and stand up straight. Avoid leaning forward when you sit, or hunching over when you stand. ? If you work at a desk, sit close to it so you do not need to lean over. Keep your chin tucked in. Keep your neck drawn back, and keep your elbows bent at a right angle. Your arms should look like the letter "L." ? Sit high and close to the steering wheel when you drive. Add lower back (lumbar) support to your car seat, if needed.  Take short walks on even surfaces as soon as you are able. Try to increase the length of time you walk each day.  Do not sit, drive, or stand in one place for more than 30 minutes at a time. Sitting or standing for long periods of time can put stress on your back.  Do not drive or use heavy machinery while taking prescription pain medicine.  Use proper lifting techniques. When you bend and lift, use positions that put less  stress on your back: ? Bend your knees. ? Keep the load close to your body. ? Avoid twisting.  Exercise regularly as told by your health care provider. Exercising helps your back heal faster and helps prevent back injuries by keeping muscles strong and flexible.  Work with a physical therapist to make a safe exercise program, as recommended by your health care provider. Do any exercises as told by your physical therapist. Lifestyle  Maintain a healthy weight. Extra weight puts stress on your back and makes it difficult to have good posture.  Avoid activities or situations that make you feel anxious or stressed. Stress and anxiety increase muscle tension and can make  back pain worse. Learn ways to manage anxiety and stress, such as through exercise. General instructions  Sleep on a firm mattress in a comfortable position. Try lying on your side with your knees slightly bent. If you lie on your back, put a pillow under your knees.  Follow your treatment plan as told by your health care provider. This may include: ? Cognitive or behavioral therapy. ? Acupuncture or massage therapy. ? Meditation or yoga. Contact a health care provider if:  You have pain that is not relieved with rest or medicine.  You have increasing pain going down into your legs or buttocks.  Your pain does not improve after 2 weeks.  You have pain at night.  You lose weight without trying.  You have a fever or chills. Get help right away if:  You develop new bowel or bladder control problems.  You have unusual weakness or numbness in your arms or legs.  You develop nausea or vomiting.  You develop abdominal pain.  You feel faint. Summary  Acute back pain is sudden and usually short-lived.  Use proper lifting techniques. When you bend and lift, use positions that put less stress on your back.  Take over-the-counter and prescription medicines and apply heat or ice as directed by your health care provider. This information is not intended to replace advice given to you by your health care provider. Make sure you discuss any questions you have with your health care provider. Document Revised: 12/04/2018 Document Reviewed: 03/29/2017 Elsevier Patient Education  [employer].   If you have lab work done today you will be contacted with your lab results within the next 2 weeks.  If you have not heard from [place] then please contact us. The fastest way to get your results is to register for My Chart.   IF you received an x-ray today, you will receive an invoice from [facility]. Please contact [facility] at [phone] with questions or concerns  regarding your invoice.   IF you received labwork today, you will receive an invoice from [place]. Please contact [facility] at [phone] with questions or concerns regarding your invoice.   Our billing staff will not be able to assist you with questions regarding bills from these companies.  You will be contacted with the lab results as soon as they are available. The fastest way to get your results is to activate your My Chart account. Instructions are located on the last page of this paperwork. If you have not heard from [place] regarding the results in 2 weeks, please contact this office.

## 2020-02-20 ENCOUNTER — Encounter: Payer: Self-pay | Admitting: Family Medicine

## 2020-02-21 ENCOUNTER — Telehealth: Payer: Self-pay | Admitting: Family Medicine

## 2020-02-21 NOTE — Telephone Encounter (Signed)
LVM for patient to inform info should be available on mychart . Sent mychart text

## 2020-02-21 NOTE — Telephone Encounter (Signed)
Patient is calling to request xrays to be emailed to him - [email or web address]

## 2020-02-27 ENCOUNTER — Encounter: Payer: Self-pay | Admitting: Family Medicine

## 2020-02-27 ENCOUNTER — Ambulatory Visit (INDEPENDENT_AMBULATORY_CARE_PROVIDER_SITE_OTHER): Payer: BC Managed Care – PPO | Admitting: Family Medicine

## 2020-02-27 ENCOUNTER — Other Ambulatory Visit: Payer: Self-pay

## 2020-02-27 VITALS — BP 115/74 | HR 76 | Temp 98.3°F | Resp 15 | Ht 72.0 in | Wt 155.2 lb

## 2020-02-27 DIAGNOSIS — M5440 Lumbago with sciatica, unspecified side: Secondary | ICD-10-CM | POA: Diagnosis not present

## 2020-02-27 NOTE — Progress Notes (Signed)
Subjective:  Patient ID: Richard Beasley, male    DOB: [birth year]  Age: 24 y.o. MRN: [id number]  CC:  Chief Complaint  Patient presents with  . Back Pain    pt is following up 1 week from back pain, states it is still painful but slightly better from last visit, pt took cyclobenzaprine but notes that he fell asleep did not notice any real relief    HPI Richard Beasley presents for   Bilateral low back pain with sciatica.  Follow-up from June 23 visit.  Acute pain at that time with previous chronic back pain.  Had been doing well prior to that more recent injury.  X-ray notable for disc height loss at L5-S1.  Potential for herniated nucleus pulposus.  Initial trial of meloxicam, Flexeril.  Back a little better, still having radiating pain down left leg. Sedation with flexeril, not sure if helped - initially more.  mobic QD. Back 60% better. Sore if moving after seated position. Leg 50% better.  No bowel or bladder incontinence, no saddle anesthesia, no lower extremity weakness.   Able to return to work.   History Patient Active Problem List   Diagnosis Date Noted  . Hydrocele, left 01/23/2015  . Crohn's disease ([place]) 01/23/2015  . Intestinal obstruction ([place]) 06/24/2011   Past Medical History:  Diagnosis Date  . Fatigue 01/19/2011  . Generalized headaches 01/19/2011  . IBS (irritable bowel syndrome) 01/19/2011  . Nausea & vomiting 01/19/2011  . Pilonidal cyst 01/19/2011   Past Surgical History:  Procedure Laterality Date  . ELBOW FRACTURE SURGERY  2000  . FRACTURE SURGERY  2000   Elbow  . HYDROCELE EXCISION / REPAIR  2009   Allergies  Allergen Reactions  . Ceclor [Cefaclor]    Prior to Admission medications   Medication Sig Start Date End Date Taking? Authorizing Provider  meloxicam (MOBIC) 7.5 MG tablet Take 1 tablet (7.5 mg total) by mouth daily. 02/19/20  Yes Wendie Agreste, MD  cyclobenzaprine (FLEXERIL) 5 MG tablet Take 1 tablet (5 mg total) by mouth 3 (three) times  daily as needed for muscle spasms (start qhs prn due to sedation). Patient not taking: Reported on 02/27/2020 02/19/20   Wendie Agreste, MD   Social History   Socioeconomic History  . Marital status: Single    Spouse name: Not on file  . Number of children: 0  . Years of education: Not on file  . Highest education level: Not on file  Occupational History    Employer: [employer]  Tobacco Use  . Smoking status: Never Smoker  . Smokeless tobacco: Never Used  Vaping Use  . Vaping Use: Never used  Substance and Sexual Activity  . Alcohol use: No  . Drug use: No  . Sexual activity: Not Currently  Other Topics Concern  . Not on file  Social History Narrative  . Not on file   Social Determinants of Health   Financial Resource Strain:   . Difficulty of Paying Living Expenses:   Food Insecurity:   . Worried About Charity fundraiser in the Last Year:   . Arboriculturist in the Last Year:   Transportation Needs:   . Film/video editor (Medical):   Marland Kitchen Lack of Transportation (Non-Medical):   Physical Activity:   . Days of Exercise per Week:   . Minutes of Exercise per Session:   Stress:   . Feeling of Stress :   Social Connections:   . Frequency  of Communication with Friends and Family:   . Frequency of Social Gatherings with Friends and Family:   . Attends Religious Services:   . Active Member of Clubs or Organizations:   . Attends Archivist Meetings:   Marland Kitchen Marital Status:   Intimate Partner Violence:   . Fear of Current or Ex-Partner:   . Emotionally Abused:   Marland Kitchen Physically Abused:   . Sexually Abused:     Review of Systems Per HPI.   Objective:   Vitals:   02/27/20 1501  BP: 115/74  Pulse: 76  Resp: 15  Temp: 98.3 F (36.8 C)  TempSrc: Temporal  SpO2: 96%  Weight: 155 lb 3.2 oz (70.4 kg)  Height: 6' (1.829 m)     Physical Exam Constitutional:      General: He is not in acute distress.    Appearance: He is well-developed.  HENT:      Head: Normocephalic and atraumatic.  Cardiovascular:     Rate and Rhythm: Normal rate.  Pulmonary:     Effort: Pulmonary effort is normal.  Musculoskeletal:     Comments: Wearing back brace.  Lumbar spine, no focal bony tenderness.  Tender to palpation slightly over left sciatic notch.  Flexion limited to approximately 45 degrees, slight decreased extension, equal lateral flexion, rotation with slight discomfort at the left sciatic notch.  Able to heel and toe walk without difficulty.  Ambulates without assistive device.  Positive seated straight leg raise with pain radiating down left leg.  Skin:    General: Skin is warm and dry.  Neurological:     Mental Status: He is alert and oriented to person, place, and time.        Assessment & Plan:  Richard Beasley is a 24 y.o. male . Bilateral low back pain with sciatica, sciatica laterality unspecified, unspecified chronicity - Plan: Ambulatory referral to Neurosurgery Improved symptoms since last visit on meloxicam, Flexeril.  Option of prednisone discussed but with improvement will continue meloxicam for now, refer to neurosurgery to decide on advanced imaging versus PT versus other treatment.  If not continuing to improve in the next week, could temporarily hold meloxicam, short course of prednisone.  RTC precautions given.  No orders of the defined types were placed in this encounter.  Patient Instructions     Ok to remain on meloxicam, flexeril only if needed. I will refer you to Dr. Vertell Limber, and can decide on MRI at his visit.  If pain not continuing to improve, let me know and we could consider switching meloxicam to prednisone temporarily.   Return to the clinic or go to the nearest emergency room if any of your symptoms worsen or new symptoms occur.   If you have lab work done today you will be contacted with your lab results within the next 2 weeks.  If you have not heard from [place] then please contact us. The fastest way to get  your results is to register for My Chart.   IF you received an x-ray today, you will receive an invoice from [facility]. Please contact [facility] at [phone] with questions or concerns regarding your invoice.   IF you received labwork today, you will receive an invoice from [place]. Please contact [facility] at [phone] with questions or concerns regarding your invoice.   Our billing staff will not be able to assist you with questions regarding bills from these companies.  You will be contacted with the lab results as soon as they  are available. The fastest way to get your results is to activate your My Chart account. Instructions are located on the last page of this paperwork. If you have not heard from [place] regarding the results in 2 weeks, please contact this office.         Signed, Merri Ray, MD [facility]

## 2020-02-27 NOTE — Patient Instructions (Addendum)
   Ok to remain on meloxicam, flexeril only if needed. I will refer you to Dr. Vertell Limber, and can decide on MRI at his visit.  If pain not continuing to improve, let me know and we could consider switching meloxicam to prednisone temporarily.   Return to the clinic or go to the nearest emergency room if any of your symptoms worsen or new symptoms occur.   If you have lab work done today you will be contacted with your lab results within the next 2 weeks.  If you have not heard from [place] then please contact us. The fastest way to get your results is to register for My Chart.   IF you received an x-ray today, you will receive an invoice from [facility]. Please contact [facility] at [phone] with questions or concerns regarding your invoice.   IF you received labwork today, you will receive an invoice from [place]. Please contact [facility] at [phone] with questions or concerns regarding your invoice.   Our billing staff will not be able to assist you with questions regarding bills from these companies.  You will be contacted with the lab results as soon as they are available. The fastest way to get your results is to activate your My Chart account. Instructions are located on the last page of this paperwork. If you have not heard from [place] regarding the results in 2 weeks, please contact this office.

## 2020-03-16 ENCOUNTER — Telehealth: Payer: Self-pay | Admitting: Family Medicine

## 2020-03-16 DIAGNOSIS — M5440 Lumbago with sciatica, unspecified side: Secondary | ICD-10-CM

## 2020-03-16 NOTE — Telephone Encounter (Signed)
Pt called and the referral office is needing pt to have a MRI done before his appt with them. Please advise.

## 2020-03-18 NOTE — Telephone Encounter (Signed)
Please put the MRI order in.   I have called pt back and he stated that he needed to have an MRI done before he see the neurosurgeon. He is an appointment set up to see Neurosurgery on Aug 16.  Please advise on the order as urgent.   Thanks

## 2020-03-19 NOTE — Telephone Encounter (Signed)
MRI order placed.

## 2020-03-27 ENCOUNTER — Telehealth: Payer: Self-pay | Admitting: Family Medicine

## 2020-03-27 DIAGNOSIS — M5440 Lumbago with sciatica, unspecified side: Secondary | ICD-10-CM

## 2020-03-27 NOTE — Telephone Encounter (Signed)
Dr Carlota Raspberry, Pts insurance has initially denied MRI, they are asking for a peer to peer  Please call [phone] [place] [employer] # [id number] This case will close on 03/31/20

## 2020-03-31 NOTE — Addendum Note (Signed)
Addended by: Merri Ray R on: 03/31/2020 04:18 PM   Modules accepted: Orders

## 2020-03-31 NOTE — Telephone Encounter (Signed)
Peer-to-peer review completed, 19-minute call.  Provided information from initial visit and follow-up visit in July 1.  MRI is not authorized at this time as over 6 weeks of treatment was needed with follow-up prior to them authorizing an MRI.   Please call patient/parent -  Advise him that at this time MRI has not been authorized, but if he is still symptomatic would recommend office visit to determine next step.  It appears that neurosurgery will not see him prior to MRI, so I can see him in follow-up, or we can refer to other back specialist if he would prefer.  Let me know what they would like to do.

## 2020-03-31 NOTE — Telephone Encounter (Signed)
Noted.  Will ask referrals to check in with previous office to see if MRI still needed or can refer to different back specialist.

## 2020-03-31 NOTE — Telephone Encounter (Signed)
Patient made aware of pier to pier results and treatment requirements prior to approval of MRI. He would like to proceed with a referral to back specialist .

## 2020-03-31 NOTE — Telephone Encounter (Signed)
LVM for patient to inform per dr notes regarding MRI and additional options for treatment recommendations.

## 2020-04-02 ENCOUNTER — Other Ambulatory Visit: Payer: Self-pay

## 2020-04-15 ENCOUNTER — Telehealth: Payer: Self-pay | Admitting: Family Medicine

## 2020-04-15 NOTE — Telephone Encounter (Signed)
Pt called requesting a referral for a specialist without an MRI due to insurance denying neuro referral. Is this something we can do?

## 2020-04-15 NOTE — Telephone Encounter (Signed)
Please see my previous note on 03/27/20. Can we check with referrals to see if that prior office still needed MRI to see him? I placed new referral on 8/3 which appears to be denied. Can refer to other back specialist if that office will not see him. Let me know and I will place new referral if needed.

## 2020-04-15 NOTE — Telephone Encounter (Signed)
Pt called requesting a referral for a specialist without an MRI due to insurance denying neuro referral. Please advise

## 2020-04-16 NOTE — Telephone Encounter (Signed)
Did the previous neuro office require an MRI to see the patient? Pt insurance wont cover MRI but still wants referral

## 2020-07-28 IMAGING — DX DG LUMBAR SPINE COMPLETE 4+V
5 series · 5 of 5 positions shown · non-contrast
Comparison: None.

CLINICAL DATA: Low back pain after jumping 4 days ago.

EXAM:
LUMBAR SPINE - COMPLETE 4+ VIEW

[l-spine ap]
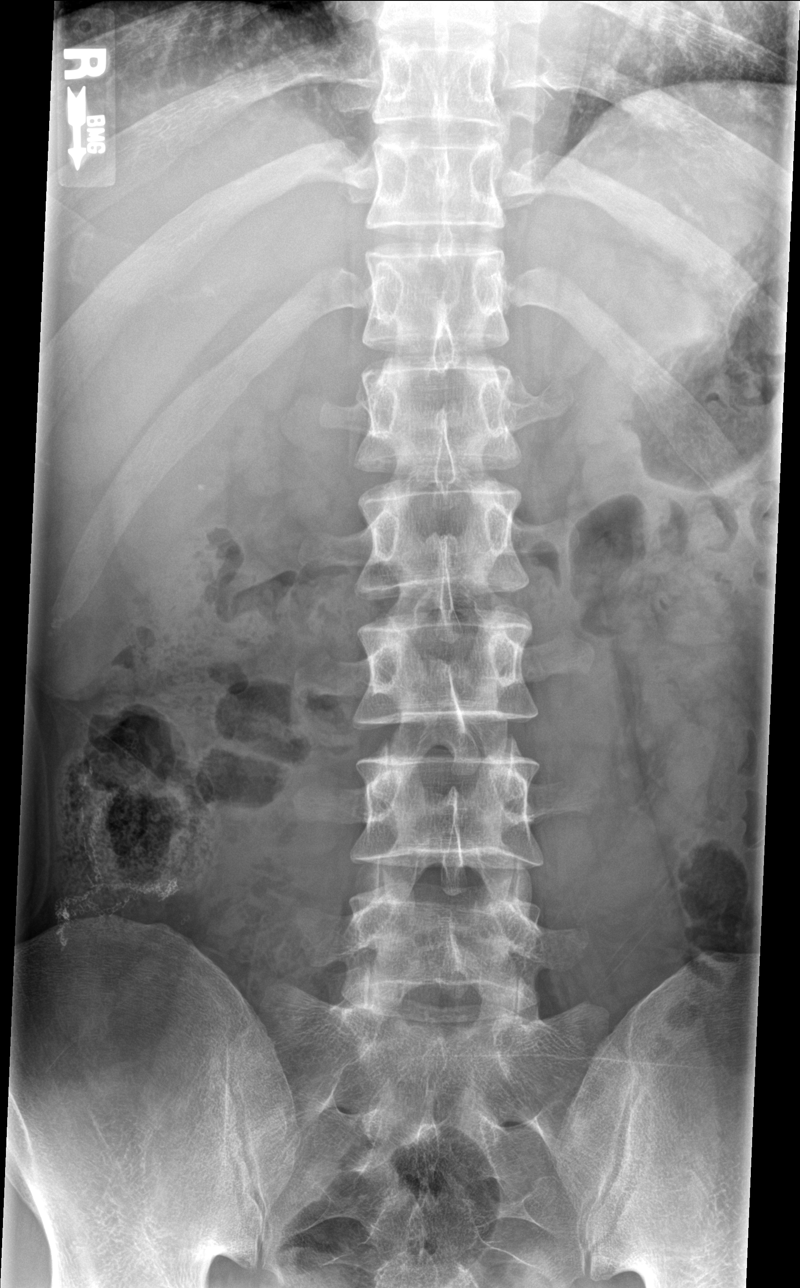

[l-spine obl (1 of 2)]
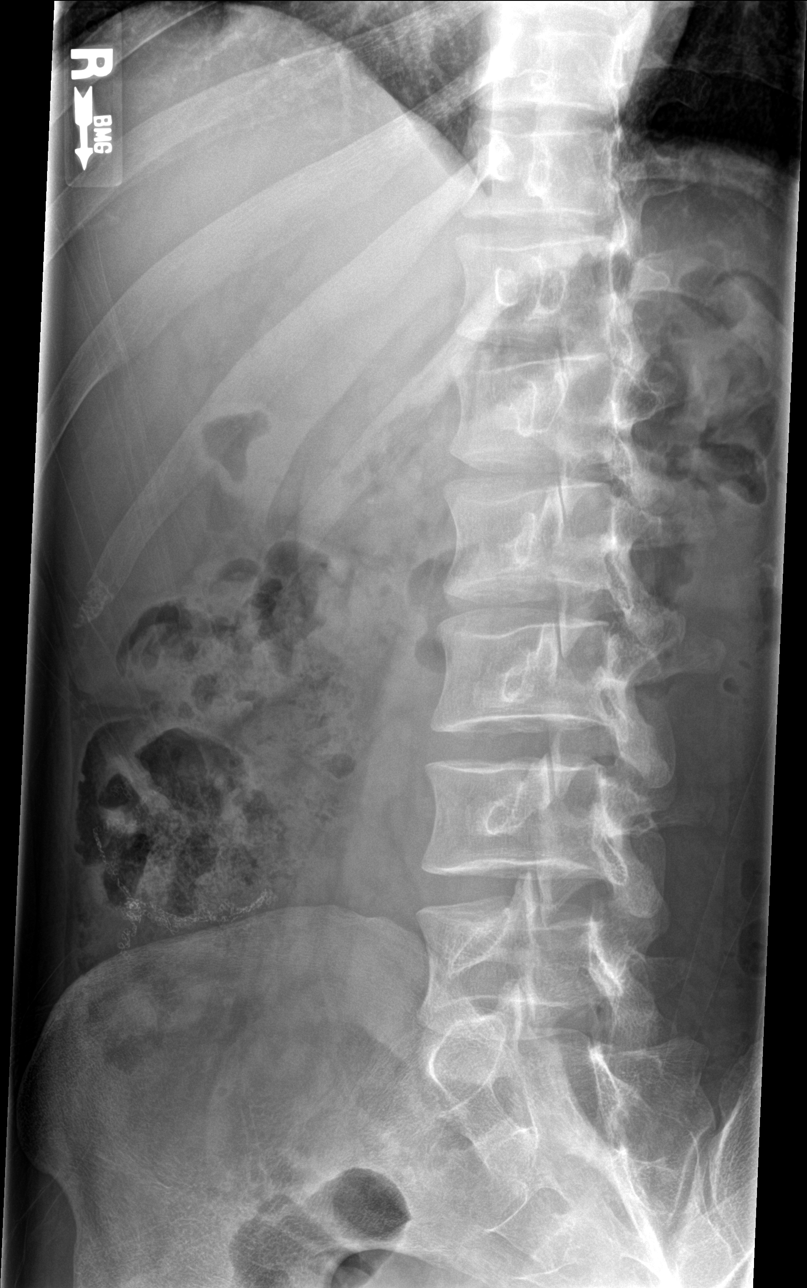

[l-spine obl (2 of 2)]
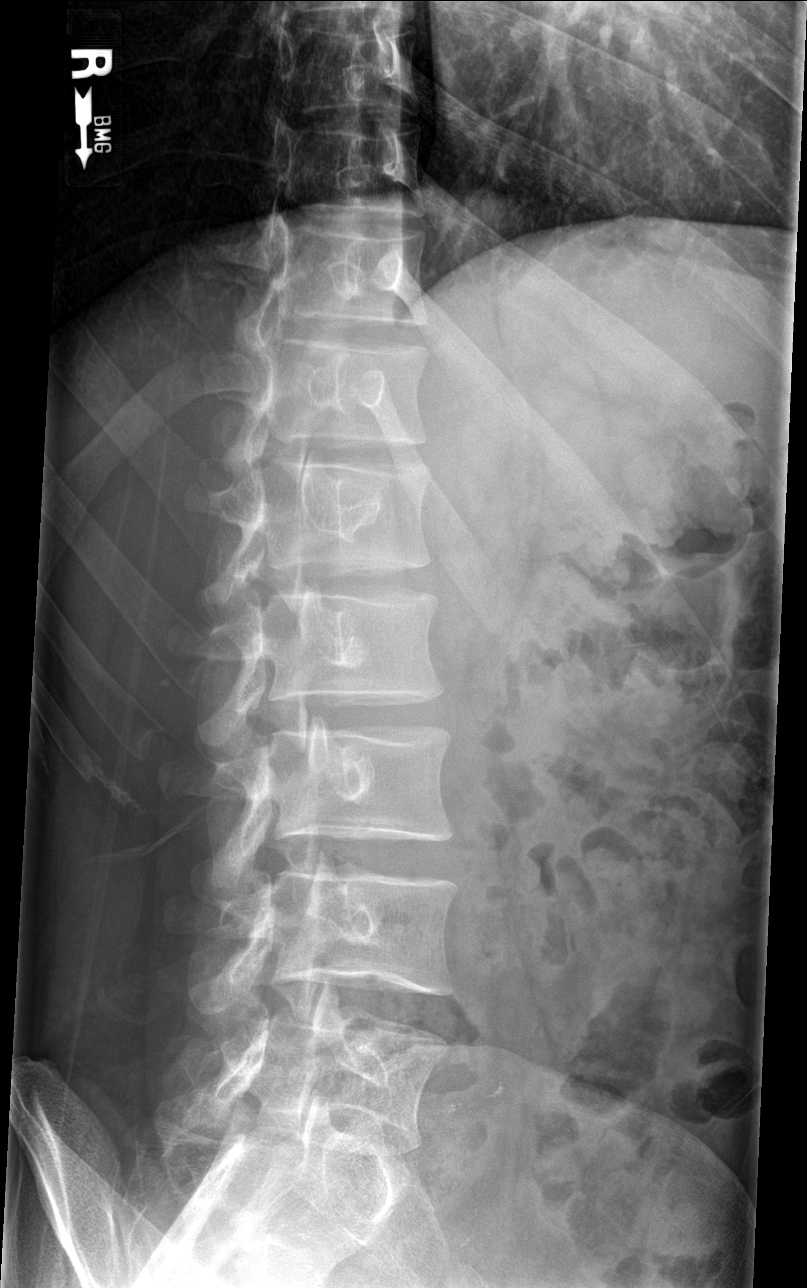

[l-spine lat]
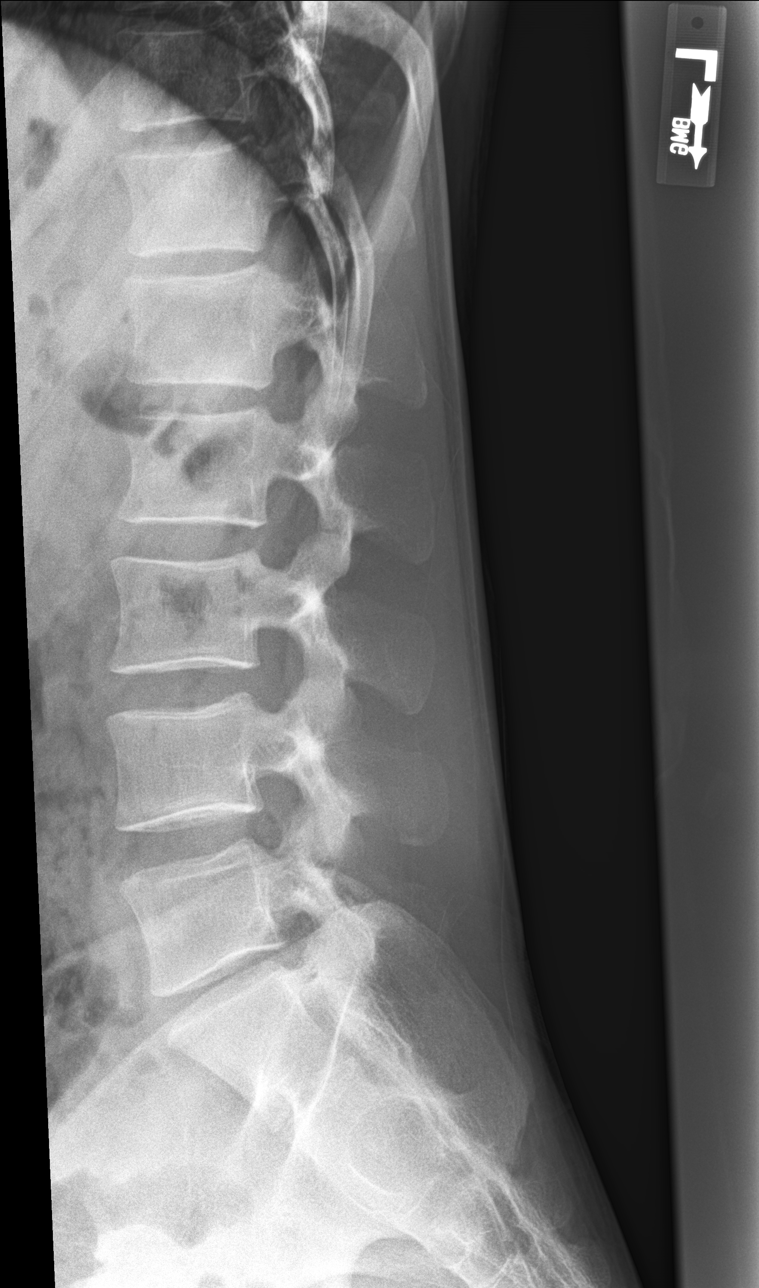

[l-spine l5-s1]
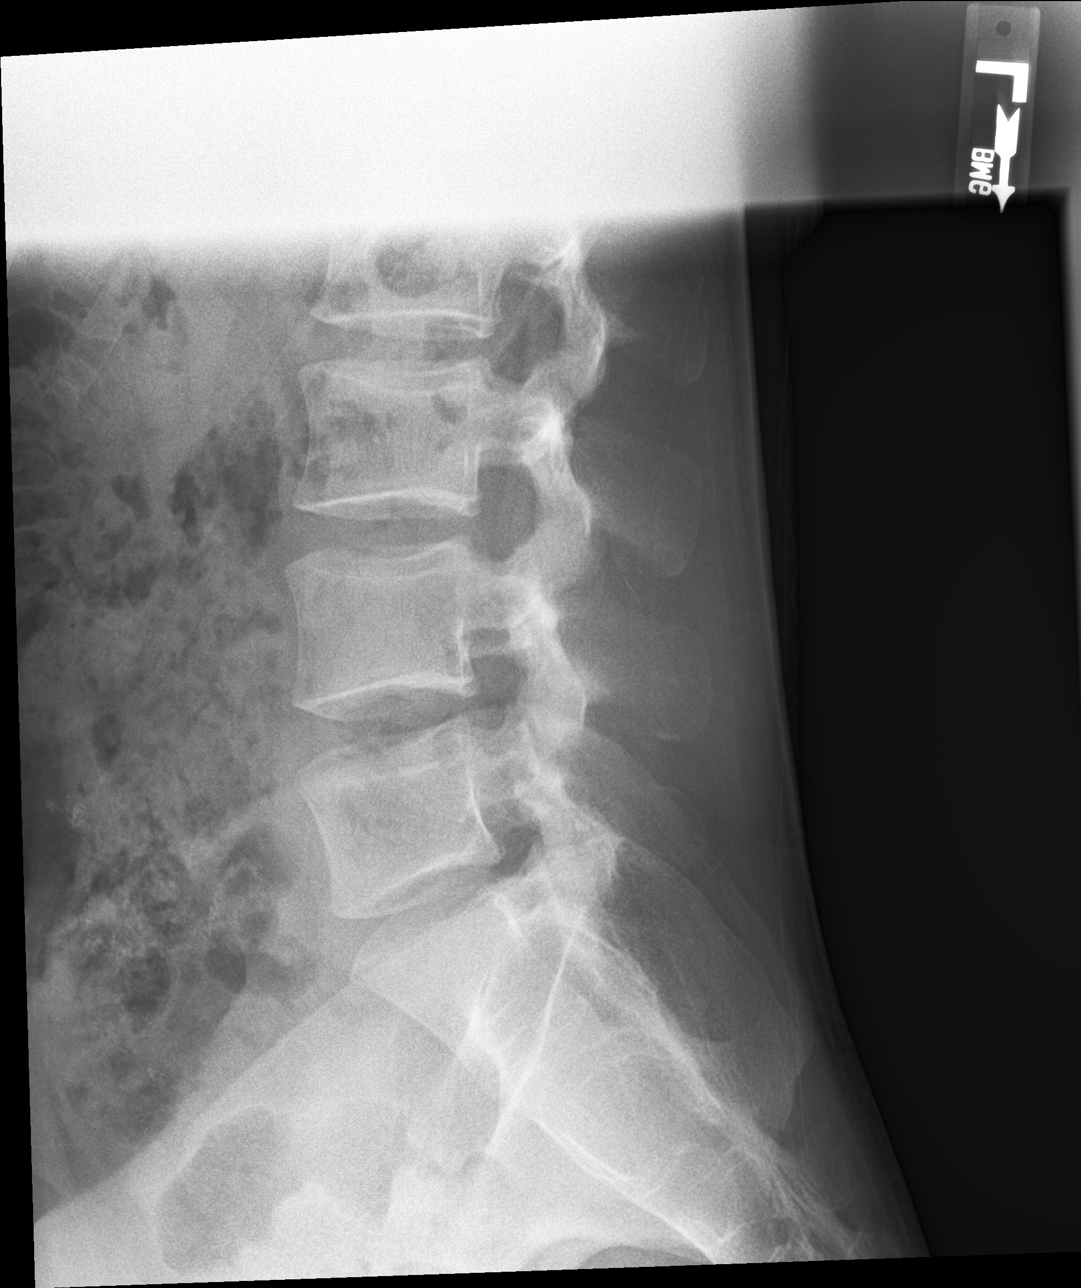

[5 of 5 positions shown; findings below may reference images not displayed]

FINDINGS: There are 5 nonrib bearing lumbar-type vertebral bodies.

The vertebral body heights are maintained. There is no acute
fracture.

There is no static listhesis. There is no spondylolysis.

There is mild degenerative disease with disc height loss at L5-S1.

The SI joints are unremarkable.
IMPRESSION: No acute osseous injury of the lumbar spine.

## 2021-04-27 ENCOUNTER — Telehealth: Payer: Self-pay | Admitting: Family Medicine

## 2021-04-27 ENCOUNTER — Encounter: Payer: Self-pay | Admitting: Family Medicine

## 2021-04-27 VITALS — Wt 171.0 lb

## 2021-04-27 DIAGNOSIS — K6289 Other specified diseases of anus and rectum: Secondary | ICD-10-CM

## 2021-04-27 NOTE — Progress Notes (Signed)
Virtual Visit via Video Note  I connected with Richard Beasley  on 04/27/21 at  5:40 PM EDT by a video enabled telemedicine application and verified that I am speaking with the correct person using two identifiers.  Location patient: home, [place] Location provider:work or home office Persons participating in the virtual visit: patient, provider   HPI:  Acute telemedicine visit for ? 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  Past Medical History:  Diagnosis Date   Fatigue 01/19/2011   Generalized headaches 01/19/2011   IBS (irritable bowel syndrome) 01/19/2011   Nausea & vomiting 01/19/2011   Pilonidal cyst 01/19/2011    Past Surgical History:  Procedure Laterality Date   ELBOW FRACTURE SURGERY  2000   FRACTURE SURGERY  2000   Elbow   HYDROCELE EXCISION / REPAIR  2009    No current outpatient medications on file.  EXAM:  VITALS per patient if applicable:  GENERAL: alert, oriented, appears well and in no acute distress  HEENT: atraumatic, conjunttiva clear, no obvious abnormalities on inspection of external nose and ears  NECK: normal movements of the head and neck  LUNGS: on inspection no signs of respiratory distress, breathing rate appears normal, no obvious gross SOB, gasping or wheezing  CV: no obvious cyanosis  GU/REctal: not examine given video visit  MS: moves all visible extremities without noticeable abnormality  PSYCH/NEURO: pleasant and cooperative, no obvious depression or anxiety, speech and thought processing grossly intact  ASSESSMENT AND PLAN:  Discussed the following assessment and plan:  Pain, rectum  -we discussed possible serious and likely etiologies, options for evaluation and workup,  limitations of telemedicine visit vs in person visit, treatment, treatment risks and precautions. Given reported symptoms and unable to examine over video visit advised inperson evaluation. Discussed options for inperson care if pcp office not available and will not charge for this visit. He agrees to seek care at urgent care or call PCP office first thing in the morning to request an appt in the morning. Advised if any sig pain, swelling redness, malaise, fevers, worsening, etc to seek inperson care right away.   I discussed the assessment and treatment plan with the patient. The patient was provided an opportunity to ask questions and all were answered. The patient agreed with the plan and demonstrated an understanding of the instructions.     Lucretia Kern, DO

## 2021-04-27 NOTE — Patient Instructions (Signed)
Will need an inperson evaluation. I did not submit a charge for this visit. Please seek care at the urgent care or with your primary care office.     I hope you are feeling better soon!  Seek in person care promptly if your symptoms worsen or new concerns arise.   It was nice to meet you today. I help [facility] out with telemedicine visits on Tuesdays and Thursdays and am available for visits on those days. If you have any concerns or questions following this visit please schedule a follow up visit with your Primary Care doctor or seek care at a local urgent care clinic to avoid delays in care.

## 2021-04-28 ENCOUNTER — Ambulatory Visit: Payer: Self-pay | Admitting: Family Medicine

## 2021-04-28 ENCOUNTER — Other Ambulatory Visit: Payer: Self-pay

## 2021-04-28 ENCOUNTER — Ambulatory Visit (INDEPENDENT_AMBULATORY_CARE_PROVIDER_SITE_OTHER): Payer: Self-pay | Admitting: Family Medicine

## 2021-04-28 VITALS — BP 120/64 | HR 78 | Temp 98.4°F | Wt 173.8 lb

## 2021-04-28 DIAGNOSIS — K6289 Other specified diseases of anus and rectum: Secondary | ICD-10-CM

## 2021-04-28 DIAGNOSIS — K50919 Crohn's disease, unspecified, with unspecified complications: Secondary | ICD-10-CM

## 2021-04-28 NOTE — Patient Instructions (Signed)
I will set referral back to GI in [facility]  Consider warm sitz baths, as discussed  Follow up for any fever, increased swelling

## 2021-04-28 NOTE — Progress Notes (Signed)
Established Patient Office Visit  Subjective:  Patient ID: Richard Beasley, male    DOB: 02-21-1996  Age: 25 y.o. MRN: [id number]  CC:  Chief Complaint  Patient presents with   Referral    Needs referral to re-establish at [facility], cyst, x 2 days, some discomfort and bleeding    HPI Richard Beasley presents as a work in today with some mild perianal discomfort past couple days.  He has reported history of Crohn's disease but has not seen GI in a few years and not currently taking any medications.  Past history of left-sided hydrocele.  He apparently had what sounds like either perianal or perirectal abscess in the past and apparently had seen surgeon for some sort of drainage years ago.  He states he has had some very mild discomfort but no fevers or chills.  No drainage.  No bleeding.  He is requesting referral back to [facility] where he has been seen previously.  Denies any recent change in bowel habits.  Past Medical History:  Diagnosis Date   Fatigue 01/19/2011   Generalized headaches 01/19/2011   IBS (irritable bowel syndrome) 01/19/2011   Nausea & vomiting 01/19/2011   Pilonidal cyst 01/19/2011    Past Surgical History:  Procedure Laterality Date   ELBOW FRACTURE SURGERY  2000   FRACTURE SURGERY  2000   Elbow   HYDROCELE EXCISION / REPAIR  2009    No family history on file.  Social History   Socioeconomic History   Marital status: Single    Spouse name: Not on file   Number of children: 0   Years of education: Not on file   Highest education level: Not on file  Occupational History    Employer: [employer]  Tobacco Use   Smoking status: Never   Smokeless tobacco: Never  Vaping Use   Vaping Use: Never used  Substance and Sexual Activity   Alcohol use: No   Drug use: No   Sexual activity: Not Currently  Other Topics Concern   Not on file  Social History Narrative   Not on file   Social Determinants of Health   Financial Resource Strain:  Not on file  Food Insecurity: Not on file  Transportation Needs: Not on file  Physical Activity: Not on file  Stress: Not on file  Social Connections: Not on file  Intimate Partner Violence: Not on file    No outpatient medications prior to visit.   No facility-administered medications prior to visit.    Allergies  Allergen Reactions   Ceclor [Cefaclor]     ROS Review of Systems  Constitutional:  Negative for chills and fever.  Gastrointestinal:  Negative for abdominal pain, anal bleeding, blood in stool, diarrhea, nausea and vomiting.     Objective:    Physical Exam Vitals reviewed.  Constitutional:      Appearance: Normal appearance.  Cardiovascular:     Rate and Rhythm: Normal rate and regular rhythm.  Pulmonary:     Effort: Pulmonary effort is normal.     Breath sounds: Normal breath sounds.  Genitourinary:    Comments: No evidence for pilonidal edema or abscess.  Anal exam reveals no external hemorrhoids.  We did not appreciate any erythema, visible swelling, or any areas of fluctuance.  He does have an area just superior to the anus on the left that is very minimally tender to palpation but again cannot appreciate any palpable mass or any fluctuance or erythema. Neurological:  Mental Status: He is alert.    BP 120/64 (BP Location: Left Arm, Patient Position: Sitting, Cuff Size: Normal)   Pulse 78   Temp 98.4 F (36.9 C) (Oral)   Wt 173 lb 12.8 oz (78.8 kg)   SpO2 97%   BMI 23.57 kg/m  Wt Readings from Last 3 Encounters:  04/28/21 173 lb 12.8 oz (78.8 kg)  04/27/21 171 lb (77.6 kg)  02/27/20 155 lb 3.2 oz (70.4 kg)     Health Maintenance Due  Topic Date Due   Hepatitis C Screening  Never done   TETANUS/TDAP  Never done   COVID-19 Vaccine (3 - Booster for Moderna series) 05/26/2020   INFLUENZA VACCINE  03/29/2021    There are no preventive care reminders to display for this patient.  Lab Results  Component Value Date   TSH 2.250 11/16/2017    Lab Results  Component Value Date   WBC 6.2 11/16/2017   HGB 14.5 11/16/2017   HCT 44.9 11/16/2017   MCV 92.8 11/16/2017   PLT 213 06/05/2015   Lab Results  Component Value Date   NA 143 11/16/2017   K 5.1 11/16/2017   CO2 25 11/16/2017   GLUCOSE 74 11/16/2017   BUN 11 11/16/2017   CREATININE 0.92 11/16/2017   BILITOT 0.5 11/16/2017   ALKPHOS 62 11/16/2017   AST 17 11/16/2017   ALT 15 11/16/2017   PROT 7.1 11/16/2017   ALBUMIN 4.7 11/16/2017   CALCIUM 9.6 11/16/2017   ANIONGAP 6 06/05/2015   No results found for: CHOL No results found for: HDL No results found for: LDLCALC No results found for: TRIG No results found for: CHOLHDL No results found for: HGBA1C    Assessment & Plan:   Patient has history of Crohn's disease and presents with some mild perianal discomfort on the right side superior to the anal region.  No evidence on exam to suggest obvious abscess.  We did discuss the fact that he is at high risk for things like fistula and certainly could have something deeper-but no obvious evidence on exam at this time.Marland Kitchen  He denies any fevers or chills.  Symptoms are relatively mild at this time.  -Observe closely for any fever, chills, increased redness or swelling -Set up referral to GI at [facility] for follow-up regarding his Crohn's disease  No orders of the defined types were placed in this encounter.   Follow-up: No follow-ups on file.    Carolann Littler, MD

## 2021-10-07 DIAGNOSIS — Z6822 Body mass index (BMI) 22.0-22.9, adult: Secondary | ICD-10-CM | POA: Diagnosis not present

## 2021-10-07 DIAGNOSIS — K5090 Crohn's disease, unspecified, without complications: Secondary | ICD-10-CM | POA: Diagnosis not present

## 2022-02-21 DIAGNOSIS — Z980 Intestinal bypass and anastomosis status: Secondary | ICD-10-CM | POA: Diagnosis not present

## 2022-02-21 DIAGNOSIS — K633 Ulcer of intestine: Secondary | ICD-10-CM | POA: Diagnosis not present

## 2022-02-21 DIAGNOSIS — K5090 Crohn's disease, unspecified, without complications: Secondary | ICD-10-CM | POA: Diagnosis not present

## 2022-02-21 DIAGNOSIS — Z79624 Long term (current) use of inhibitors of nucleotide synthesis: Secondary | ICD-10-CM | POA: Diagnosis not present

## 2022-02-21 DIAGNOSIS — K5000 Crohn's disease of small intestine without complications: Secondary | ICD-10-CM | POA: Diagnosis not present

## 2023-01-25 ENCOUNTER — Other Ambulatory Visit: Payer: Self-pay | Admitting: Student in an Organized Health Care Education/Training Program

## 2023-01-25 DIAGNOSIS — M5116 Intervertebral disc disorders with radiculopathy, lumbar region: Secondary | ICD-10-CM

## 2023-01-30 ENCOUNTER — Encounter: Payer: Self-pay | Admitting: Student in an Organized Health Care Education/Training Program

## 2023-02-02 ENCOUNTER — Ambulatory Visit
Admission: RE | Admit: 2023-02-02 | Discharge: 2023-02-02 | Disposition: A | Discharge status: Home / Self Care | Payer: Managed Care, Other (non HMO) | Source: Ambulatory Visit | Attending: Student in an Organized Health Care Education/Training Program | Admitting: Student in an Organized Health Care Education/Training Program

## 2023-02-02 DIAGNOSIS — M5116 Intervertebral disc disorders with radiculopathy, lumbar region: Secondary | ICD-10-CM
